# Patient Record
Sex: Female | Born: 1965 | Race: White | Hispanic: No | Marital: Married | Smoking: Former smoker
Health system: Southern US, Community
[De-identification: ages and names within clinical notes are randomized; demographics above are authoritative.]

## PROBLEM LIST (undated history)

## (undated) DIAGNOSIS — J329 Chronic sinusitis, unspecified: Secondary | ICD-10-CM

## (undated) DIAGNOSIS — G43909 Migraine, unspecified, not intractable, without status migrainosus: Secondary | ICD-10-CM

## (undated) DIAGNOSIS — S065XAA Traumatic subdural hemorrhage with loss of consciousness status unknown, initial encounter: Secondary | ICD-10-CM

## (undated) DIAGNOSIS — F4310 Post-traumatic stress disorder, unspecified: Secondary | ICD-10-CM

## (undated) DIAGNOSIS — S065X9A Traumatic subdural hemorrhage with loss of consciousness of unspecified duration, initial encounter: Secondary | ICD-10-CM

## (undated) DIAGNOSIS — C801 Malignant (primary) neoplasm, unspecified: Secondary | ICD-10-CM

## (undated) HISTORY — DX: Post-traumatic stress disorder, unspecified: F43.10

## (undated) HISTORY — PX: ADENOIDECTOMY: SUR15

## (undated) HISTORY — PX: TONSILLECTOMY: SUR1361

## (undated) HISTORY — DX: Malignant (primary) neoplasm, unspecified: C80.1

## (undated) HISTORY — PX: BREAST SURGERY: SHX581

## (undated) HISTORY — PX: TYMPANOSTOMY TUBE PLACEMENT: SHX32

---

## 2007-11-18 ENCOUNTER — Emergency Department (HOSPITAL_COMMUNITY): Admission: EM | Admit: 2007-11-18 | Discharge: 2007-11-18 | Payer: Self-pay | Admitting: Emergency Medicine

## 2010-10-28 ENCOUNTER — Emergency Department (HOSPITAL_COMMUNITY): Payer: Federal, State, Local not specified - PPO

## 2010-10-28 ENCOUNTER — Emergency Department (HOSPITAL_COMMUNITY)
Admission: EM | Admit: 2010-10-28 | Discharge: 2010-10-28 | Disposition: A | Discharge status: Home / Self Care | Payer: Federal, State, Local not specified - PPO | Attending: Emergency Medicine | Admitting: Emergency Medicine

## 2010-10-28 DIAGNOSIS — T07XXXA Unspecified multiple injuries, initial encounter: Secondary | ICD-10-CM | POA: Insufficient documentation

## 2010-10-28 DIAGNOSIS — M25519 Pain in unspecified shoulder: Secondary | ICD-10-CM | POA: Insufficient documentation

## 2010-10-28 DIAGNOSIS — S7000XA Contusion of unspecified hip, initial encounter: Secondary | ICD-10-CM | POA: Insufficient documentation

## 2010-10-28 DIAGNOSIS — M25569 Pain in unspecified knee: Secondary | ICD-10-CM | POA: Insufficient documentation

## 2010-10-28 DIAGNOSIS — R350 Frequency of micturition: Secondary | ICD-10-CM | POA: Insufficient documentation

## 2010-10-28 LAB — URINE MICROSCOPIC-ADD ON

## 2010-10-28 LAB — URINALYSIS, ROUTINE W REFLEX MICROSCOPIC
Glucose, UA: NEGATIVE mg/dL
Nitrite: NEGATIVE

## 2010-10-28 LAB — POCT PREGNANCY, URINE: Preg Test, Ur: NEGATIVE

## 2011-10-27 ENCOUNTER — Encounter (HOSPITAL_COMMUNITY): Payer: Self-pay | Admitting: Emergency Medicine

## 2011-10-27 ENCOUNTER — Emergency Department (HOSPITAL_COMMUNITY): Payer: Federal, State, Local not specified - PPO

## 2011-10-27 ENCOUNTER — Inpatient Hospital Stay (HOSPITAL_COMMUNITY)
Admission: EM | Admit: 2011-10-27 | Discharge: 2011-10-29 | DRG: 765 | Disposition: A | Discharge status: Home / Self Care | Payer: Federal, State, Local not specified - PPO | Attending: General Surgery | Admitting: General Surgery

## 2011-10-27 DIAGNOSIS — M25539 Pain in unspecified wrist: Secondary | ICD-10-CM | POA: Diagnosis present

## 2011-10-27 DIAGNOSIS — S0003XA Contusion of scalp, initial encounter: Secondary | ICD-10-CM | POA: Diagnosis present

## 2011-10-27 DIAGNOSIS — S065X9A Traumatic subdural hemorrhage with loss of consciousness of unspecified duration, initial encounter: Secondary | ICD-10-CM

## 2011-10-27 DIAGNOSIS — R109 Unspecified abdominal pain: Secondary | ICD-10-CM | POA: Diagnosis present

## 2011-10-27 DIAGNOSIS — Y92410 Unspecified street and highway as the place of occurrence of the external cause: Secondary | ICD-10-CM

## 2011-10-27 DIAGNOSIS — IMO0002 Reserved for concepts with insufficient information to code with codable children: Secondary | ICD-10-CM | POA: Diagnosis present

## 2011-10-27 DIAGNOSIS — T07XXXA Unspecified multiple injuries, initial encounter: Secondary | ICD-10-CM | POA: Diagnosis present

## 2011-10-27 DIAGNOSIS — S060XAA Concussion with loss of consciousness status unknown, initial encounter: Secondary | ICD-10-CM

## 2011-10-27 DIAGNOSIS — S060X9A Concussion with loss of consciousness of unspecified duration, initial encounter: Principal | ICD-10-CM | POA: Diagnosis present

## 2011-10-27 DIAGNOSIS — S065XAA Traumatic subdural hemorrhage with loss of consciousness status unknown, initial encounter: Secondary | ICD-10-CM

## 2011-10-27 DIAGNOSIS — Y998 Other external cause status: Secondary | ICD-10-CM

## 2011-10-27 DIAGNOSIS — G40909 Epilepsy, unspecified, not intractable, without status epilepticus: Secondary | ICD-10-CM | POA: Diagnosis present

## 2011-10-27 DIAGNOSIS — S1093XA Contusion of unspecified part of neck, initial encounter: Secondary | ICD-10-CM | POA: Diagnosis present

## 2011-10-27 DIAGNOSIS — F17200 Nicotine dependence, unspecified, uncomplicated: Secondary | ICD-10-CM | POA: Diagnosis present

## 2011-10-27 HISTORY — DX: Migraine, unspecified, not intractable, without status migrainosus: G43.909

## 2011-10-27 HISTORY — DX: Chronic sinusitis, unspecified: J32.9

## 2011-10-27 LAB — COMPREHENSIVE METABOLIC PANEL
ALT: 20 U/L (ref 0–35)
AST: 42 U/L — ABNORMAL HIGH (ref 0–37)
Albumin: 4 g/dL (ref 3.5–5.2)
Alkaline Phosphatase: 94 U/L (ref 39–117)
CO2: 30 mEq/L (ref 19–32)
Chloride: 103 mEq/L (ref 96–112)
GFR calc Af Amer: >90 mL/min (ref >90)
GFR calc non Af Amer: >90 mL/min (ref >90)
Potassium: 3.7 mEq/L (ref 3.5–5.1)
Sodium: 140 mEq/L (ref 135–145)
Total Bilirubin: 0.2 mg/dL — ABNORMAL LOW (ref 0.3–1.2)

## 2011-10-27 LAB — CBC WITH DIFFERENTIAL/PLATELET
Hemoglobin: 13.6 g/dL (ref 12.0–15.0)
Lymphocytes Relative: 12 % (ref 12–46)
Lymphs Abs: 1.6 10*3/uL (ref 0.7–4.0)
MCHC: 34.8 g/dL (ref 30.0–36.0)
MCV: 86.3 fL (ref 78.0–100.0)
Monocytes Absolute: 0.8 10*3/uL (ref 0.1–1.0)
Monocytes Relative: 6 % (ref 3–12)
Platelets: 197 10*3/uL (ref 150–400)
RDW: 12.6 % (ref 11.5–15.5)

## 2011-10-27 MED ORDER — MORPHINE SULFATE 4 MG/ML IJ SOLN
4.0000 mg | Freq: Once | INTRAMUSCULAR | Status: AC
  Administered 2011-10-27: 4 mg via INTRAVENOUS
  Filled 2011-10-27: qty 1

## 2011-10-27 MED ORDER — SODIUM CHLORIDE 0.9 % IV BOLUS (SEPSIS)
1000.0000 mL | Freq: Once | INTRAVENOUS | Status: AC
  Administered 2011-10-27: 1000 mL via INTRAVENOUS
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
## 2011-10-27 NOTE — ED Notes (Signed)
Pt c/o left lower buttocks pain, pt has a small abrasion on left lower buttocks and a light purple bruise surrounding the area.

## 2011-10-27 NOTE — ED Notes (Signed)
Family updated as to patient's status by the [facility].

## 2011-10-27 NOTE — ED Notes (Signed)
Pt ambulated to bathroom with no assistance.  
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
## 2011-10-28 ENCOUNTER — Encounter (HOSPITAL_COMMUNITY): Payer: Self-pay | Admitting: *Deleted

## 2011-10-28 ENCOUNTER — Inpatient Hospital Stay (HOSPITAL_COMMUNITY): Payer: Federal, State, Local not specified - PPO

## 2011-10-28 MED ORDER — MORPHINE SULFATE 2 MG/ML IJ SOLN
2.0000 mg | INTRAMUSCULAR | Status: DC | PRN
  Administered 2011-10-28 (×2): 2 mg via INTRAVENOUS
  Filled 2011-10-28 (×2): qty 1

## 2011-10-28 MED ORDER — INFLUENZA VIRUS VACC SPLIT PF IM SUSP
0.5000 mL | INTRAMUSCULAR | Status: DC
  Filled 2011-10-28: qty 0.5

## 2011-10-28 MED ORDER — OXYCODONE-ACETAMINOPHEN 5-325 MG PO TABS
1.0000 | ORAL_TABLET | ORAL | Status: DC | PRN
  Administered 2011-10-28 – 2011-10-29 (×4): 1 via ORAL
  Filled 2011-10-28 (×4): qty 1

## 2011-10-28 MED ORDER — ONDANSETRON HCL 4 MG/2ML IJ SOLN
4.0000 mg | Freq: Four times a day (QID) | INTRAMUSCULAR | Status: DC | PRN
  Administered 2011-10-28 (×3): 4 mg via INTRAVENOUS
  Filled 2011-10-28 (×3): qty 2

## 2011-10-28 MED ORDER — MORPHINE SULFATE 2 MG/ML IJ SOLN
INTRAMUSCULAR | Status: AC
  Filled 2011-10-28: qty 1

## 2011-10-28 MED ORDER — DEXTROSE-NACL 5-0.9 % IV SOLN
INTRAVENOUS | Status: DC
  Administered 2011-10-28 (×2): via INTRAVENOUS

## 2011-10-28 MED ORDER — BUTABARBITAL SODIUM 30 MG PO TABS: 50.0000 mg | ORAL_TABLET | Freq: Every day | ORAL | Status: DC | PRN

## 2011-10-28 MED ORDER — METOCLOPRAMIDE HCL 5 MG/ML IJ SOLN
10.0000 mg | Freq: Once | INTRAMUSCULAR | Status: AC
  Administered 2011-10-28: 10 mg via INTRAVENOUS
  Filled 2011-10-28: qty 2

## 2011-10-28 MED ORDER — BUTALBITAL-APAP-CAFFEINE 50-325-40 MG PO TABS
1.0000 | ORAL_TABLET | ORAL | Status: DC | PRN
  Administered 2011-10-28 – 2011-10-29 (×2): 2 via ORAL
  Filled 2011-10-28 (×2): qty 2
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
## 2011-10-28 NOTE — Progress Notes (Signed)
Report given to Puja RN on 4N, to transfer via wheelchair to [id number], Berle Mull RN
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
## 2011-10-28 NOTE — ED Notes (Signed)
Pt refused bedpan, pt ambulated to in room bathroom with no difficulties.

## 2011-10-28 NOTE — Progress Notes (Signed)
UR complete 
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
## 2011-10-28 NOTE — Clinical Social Work Psychosocial (Signed)
Clinical Social Work Department BRIEF PSYCHOSOCIAL ASSESSMENT 10/28/2011  Patient:  Caitlin Kennedy, Caitlin Kennedy     Account Number:  [id number]     Admit date:  10/27/2011  Clinical Social Worker:  Thomasene Mohair  Date/Time:  10/28/2011 11:30 AM  Referred by:  Physician  Date Referred:  10/28/2011 Referred for  Psychosocial assessment   Other Referral:   Interview type:  Patient Other interview type:   Husband at bedside    PSYCHOSOCIAL DATA Living Status:  HUSBAND Admitted from facility:   Level of care:   Primary support name:  Caitlin Kennedy Primary support relationship to patient:  SPOUSE Degree of support available:   adequate    CURRENT CONCERNS Current Concerns  Adjustment to Illness   Other Concerns:    SOCIAL WORK ASSESSMENT / PLAN CSW met with Pt to complete assessment as part of the multidisciplanary team with trauma services.  Pt in bed complaining of acute headache/migraine with spouse at bedside. 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  Pt's spouse also works and they both help care for his elderly father who lives outside of the home. Pt's spouse shared that they have been married 10 years and do not have children.  Pt will likely dc to the care of her husband if needed at all. CSW will f/u to complete SBIRT when Pt is alone and feeling better.   Assessment/plan status:  Psychosocial Support/Ongoing Assessment of Needs Other assessment/ plan:   F/U for SBIRT only   Information/referral to community resources:    PATIENT'S/FAMILY'S RESPONSE TO PLAN OF CARE: Pt's spouse at bedside, stated that he's hopeful Pt will be able to leave hospital soon. Pt sitting in the dark d/t to light sensitivity, complaining of painful migraine and wearing eye mask to block out light.   Frederico Hamman, LCSW [phone] Covering for Macario Golds, LCSW Trauma Service
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
## 2011-10-29 NOTE — Progress Notes (Signed)
Hope to D/C this PM Patient examined and I agree with the assessment and plan  Violeta Gelinas, MD, MPH, FACS Pager: [phone]  10/29/2011 3:44 PM

## 2011-10-29 NOTE — Discharge Summary (Signed)
Caitlin Snelson, MD, MPH, FACS Pager: [phone]  

## 2011-10-29 NOTE — Progress Notes (Signed)
Discharge Note: Pt is alert and oriented, VS are stable, denies CP.  Telemetry & IV discontinued.  Discharge instructions reviewed with patient, pt verbalizes understanding.  Wheelchair transportation provided, all belongings with patient  
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
## 2011-10-29 NOTE — Clinical Social Work Note (Addendum)
Clinical Social Worker met with patient and patient husband at bedside to confirm no further needs at discharge.  Patient husband states that he drove his truck to the hospital and they will have appropriate transportation home.  Patient is medically clear and anxious to discharge.  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  Patient with limited current substance use at this time and has no concerns.  SBIRT complete.  No resources needed.  Clinical Social Worker will sign off for now as social work intervention is no longer needed. Please consult [place] again if new need arises.  Macario Golds, [place] [phone]

## 2012-07-07 ENCOUNTER — Encounter (HOSPITAL_COMMUNITY): Payer: Self-pay | Admitting: Emergency Medicine

## 2012-07-07 ENCOUNTER — Emergency Department (HOSPITAL_COMMUNITY)
Admission: EM | Admit: 2012-07-07 | Discharge: 2012-07-07 | Disposition: A | Discharge status: Home / Self Care | Payer: Federal, State, Local not specified - PPO | Attending: Emergency Medicine | Admitting: Emergency Medicine

## 2012-07-07 ENCOUNTER — Emergency Department (HOSPITAL_COMMUNITY): Payer: Federal, State, Local not specified - PPO

## 2012-07-07 DIAGNOSIS — G43909 Migraine, unspecified, not intractable, without status migrainosus: Secondary | ICD-10-CM | POA: Insufficient documentation

## 2012-07-07 DIAGNOSIS — F17200 Nicotine dependence, unspecified, uncomplicated: Secondary | ICD-10-CM | POA: Insufficient documentation

## 2012-07-07 DIAGNOSIS — R110 Nausea: Secondary | ICD-10-CM | POA: Insufficient documentation

## 2012-07-07 DIAGNOSIS — Y939 Activity, unspecified: Secondary | ICD-10-CM | POA: Insufficient documentation

## 2012-07-07 DIAGNOSIS — S1093XA Contusion of unspecified part of neck, initial encounter: Secondary | ICD-10-CM | POA: Insufficient documentation

## 2012-07-07 DIAGNOSIS — W2209XA Striking against other stationary object, initial encounter: Secondary | ICD-10-CM | POA: Insufficient documentation

## 2012-07-07 DIAGNOSIS — S0003XA Contusion of scalp, initial encounter: Secondary | ICD-10-CM | POA: Insufficient documentation

## 2012-07-07 DIAGNOSIS — S0083XA Contusion of other part of head, initial encounter: Secondary | ICD-10-CM

## 2012-07-07 DIAGNOSIS — J329 Chronic sinusitis, unspecified: Secondary | ICD-10-CM | POA: Insufficient documentation

## 2012-07-07 DIAGNOSIS — Y929 Unspecified place or not applicable: Secondary | ICD-10-CM | POA: Insufficient documentation

## 2012-07-07 MED ORDER — OXYCODONE-ACETAMINOPHEN 5-325 MG PO TABS: 1.0000 | ORAL_TABLET | ORAL | Status: DC | PRN

## 2012-07-07 MED ORDER — OXYCODONE-ACETAMINOPHEN 5-325 MG PO TABS
1.0000 | ORAL_TABLET | Freq: Once | ORAL | Status: AC
  Administered 2012-07-07: 1 via ORAL
  Filled 2012-07-07: qty 1

## 2012-07-07 NOTE — ED Provider Notes (Signed)
History     CSN: [id number]  Arrival date & time 07/07/12  7829   First MD Initiated Contact with Patient 07/07/12 0935      Chief Complaint  Patient presents with  . Facial Injury    (Consider location/radiation/quality/duration/timing/severity/associated sxs/prior treatment) Patient is a 47 y.o. female presenting with facial injury. The history is provided by the patient.  Facial Injury Mechanism of injury:  Direct blow Location:  Face Associated symptoms: nausea   Associated symptoms: no headaches, no neck pain and no vomiting   Associated symptoms comment:  She accidentally walked into a cement piling, hitting the left side of her face causing swelling and pain. Pain is crossing into the right side of the face as well. No LOC at the time of the accident. She reports nausea without vomiting.    Past Medical History  Diagnosis Date  . Migraine     chronic  . Sinus infection     chronic    Past Surgical History  Procedure Laterality Date  . Tonsillectomy      No family history on file.  History  Substance Use Topics  . Smoking status: Current Some Day Smoker    Types: Cigarettes  . Smokeless tobacco: Not on file  . Alcohol Use: Yes    OB History   Grav Para Term Preterm Abortions TAB SAB Ect Mult Living                  Review of Systems  Constitutional: Negative for fever.  HENT: Positive for facial swelling. Negative for neck pain and dental problem.   Respiratory: Negative for shortness of breath.   Gastrointestinal: Positive for nausea. Negative for vomiting and abdominal pain.  Skin: Negative for wound.  Neurological: Negative for headaches.  Psychiatric/Behavioral: Negative for confusion.    Allergies  Hydrocodone  Home Medications   Current Outpatient Rx  Name  Route  Sig  Dispense  Refill  . Butalbital-Acetaminophen (BUTALBITAL-APAP PO)   Oral   Take 1 tablet by mouth every 6 (six) hours as needed (headaches).         . Citalopram  Hydrobromide (CELEXA PO)   Oral   Take 0.5 tablets by mouth daily. Pt takes a 1/2 tablet. Not [facility] dose[facility] [place]         . GUAIFENESIN PO   Oral   Take 2 tablets by mouth every 6 (six) hours as needed (Allergy symptoms).           BP 123/80  Pulse 76  Temp(Src) 98.4 F (36.9 C) (Oral)  Resp 18  Wt 134 lb 8 oz (61.009 kg)  BMI 21.72 kg/m2  SpO2 98%  LMP 06/16/2012  Physical Exam  Constitutional: She is oriented to person, place, and time.  HENT:  Left facial swelling without ecchymosis. Significantly tender over left maxillary sinus and zygomatic arch. No maxillary plate tenderness or malocclusion. No mandibular tenderness. No nasal deformity.  Eyes: Conjunctivae are normal. Pupils are equal, round, and reactive to light.  Neck: Normal range of motion.  Pulmonary/Chest: Effort normal.  Musculoskeletal: Normal range of motion.  Neurological: She is alert and oriented to person, place, and time.  Ambulatory without imbalance. Cranial nerves 3-12 grossly intact.   Skin: Skin is warm and dry.  Psychiatric: She has a normal mood and affect.    ED Course  Procedures (including critical care time)  Labs Reviewed - No data to display Ct Maxillofacial Wo Cm  07/07/2012   *RADIOLOGY REPORT*  Clinical Data: Left facial injury after running into wall.  Facial swelling and pain.  CT MAXILLOFACIAL WITHOUT CONTRAST  Technique:  Multidetector CT imaging of the maxillofacial structures was performed. Multiplanar CT image reconstructions were also generated.  Comparison: CT maxillofacial 10/27/2011  Findings: There is subcutaneous stranding and soft tissue prominence in the left maxillary region, consistent with history of recent trauma to the left face.  The facial bones are intact.  Negative for facial fracture.  The paranasal sinuses are clear.  The mandibular condyles are located.  IMPRESSION: 1. Soft tissue swelling/bruising in the left maxillary region. 2.  Negative  for facial bone fracture.   Original Report Authenticated By: Britta Mccreedy, M.D.     No diagnosis found. 1. Facial contusion   MDM  No facial fractures. Doubt IC head injury with nonfocal neuro exam. Pain is improved with Percocet. Stable for discharge.         Arnoldo Hooker, PA-C 07/07/12 1114

## 2012-07-07 NOTE — ED Provider Notes (Signed)
  Medical screening examination/treatment/procedure(s) were performed by non-physician practitioner and as supervising physician I was immediately available for consultation/collaboration.    Gerhard Munch, MD 07/07/12 1135

## 2012-07-07 NOTE — ED Notes (Signed)
Pt c/o of left facial injury after she ran into a wall. C/o of facial swelling, pain 10/10, and headache. Denies LOC.

## 2014-03-21 ENCOUNTER — Emergency Department (HOSPITAL_COMMUNITY)
Admission: EM | Admit: 2014-03-21 | Discharge: 2014-03-21 | Disposition: A | Discharge status: Home / Self Care | Payer: Federal, State, Local not specified - PPO | Attending: Emergency Medicine | Admitting: Emergency Medicine

## 2014-03-21 ENCOUNTER — Encounter (HOSPITAL_COMMUNITY): Payer: Self-pay | Admitting: Emergency Medicine

## 2014-03-21 DIAGNOSIS — K088 Other specified disorders of teeth and supporting structures: Secondary | ICD-10-CM | POA: Insufficient documentation

## 2014-03-21 DIAGNOSIS — Z87891 Personal history of nicotine dependence: Secondary | ICD-10-CM | POA: Insufficient documentation

## 2014-03-21 DIAGNOSIS — Z8679 Personal history of other diseases of the circulatory system: Secondary | ICD-10-CM | POA: Insufficient documentation

## 2014-03-21 DIAGNOSIS — K0889 Other specified disorders of teeth and supporting structures: Secondary | ICD-10-CM

## 2014-03-21 MED ORDER — OXYCODONE-ACETAMINOPHEN 5-325 MG PO TABS: 1.0000 | ORAL_TABLET | ORAL | Status: DC | PRN

## 2014-03-21 MED ORDER — OXYCODONE-ACETAMINOPHEN 5-325 MG PO TABS
1.0000 | ORAL_TABLET | Freq: Once | ORAL | Status: AC
  Administered 2014-03-21: 1 via ORAL
  Filled 2014-03-21: qty 1

## 2014-03-21 NOTE — Discharge Instructions (Signed)
Take the prescribed medication as directed. Follow-up with dentist-- see referrals and resource guide. Return to the ED for new or worsening symptoms.   Emergency Department Resource Guide 1) Find a Doctor and Pay Out of Pocket Although you won't have to find out who is covered by your insurance plan, it is a good idea to ask around and get recommendations. You will then need to call the office and see if the doctor you have chosen will accept you as a new patient and what types of options they offer for patients who are self-pay. Some doctors offer discounts or will set up payment plans for their patients who do not have insurance, but you will need to ask so you aren't surprised when you get to your appointment.  2) Contact Your Local Health Department Not all health departments have doctors that can see patients for sick visits, but many do, so it is worth a call to see if yours does. If you don't know where your local health department is, you can check in your phone book. The CDC also has a tool to help you locate your state's health department, and many state websites also have listings of all of their local health departments.  3) Find a [facility] If your illness is not likely to be very severe or complicated, you may want to try a walk in clinic. These are popping up all over the country in pharmacies, drugstores, and shopping centers. They're usually staffed by nurse practitioners or physician assistants that have been trained to treat common illnesses and complaints. They're usually fairly quick and inexpensive. However, if you have serious medical issues or chronic medical problems, these are probably not your best option.  No Primary Care Doctor: - Call Health Connect at  [phone] - they can help you locate a primary care doctor that  accepts your insurance, provides certain services, etc. - Physician Referral Service- [phone]  Chronic Pain Problems: Organization          Address  Phone   Notes  [facility]  [phone] Patients need to be referred by their primary care doctor.   Medication Assistance: Organization         Address  Phone   Notes  [facility] Medication [facility] [address] [place], [place] [address] [phone] --Must be a resident of [facility] -- Must have NO insurance coverage whatsoever (no Medicaid/ Medicare, etc.) -- The pt. MUST have a primary care doctor that directs their care regularly and follows them in the community   Med[facility]ist  [phone]   [employer]  [phone]    Agencies that provide inexpensive medical care: Organization         Address  Phone   Notes  [place]  [phone]   [facility]    [phone]   [facility] [place], [place] [address] [phone]   [place] [address], [place] [phone]   Planned Parenthood    [phone]   [facility]    [phone]   [place] and [place] [address], [place] Phone:  [phone], Fax:  [phone] Hours of Operation:  9 am - 6 pm, M-F.  Also accepts Medicaid/Medicare and self-pay.  [facility] for [place] [address], [employer] Phone: [phone])  [phone], Fax: [phone]. Hours of Operation:  8:30 am - 5:30 pm, M-F.  Also accepts Medicaid and self-pay.  [facility] [place] [address], [place] Phone: [phone]   [place], [place], [place] [phone], Ext. [phone] Mondays & Thursdays: 7-9 AM.  First 15 patients are seen on a first come, first serve basis.    [place] Providers:  Organization         Address  Phone   Notes  [facility] [address], [place] [phone] Also accepts self-pay patients.  [facility] [address], [place]  [phone]   [place], [address], [place] [phone]   [facility] [address] [place] [phone]   Lucianne Lei [address], [place]   [phone] Only accepts [place] Access [place] patients after they have their name applied to their card.   Self-Pay (no insurance) in [facility]:  Organization         Address  Phone   Notes  Sickle Cell Patients, [facility] [place] [phone]   [facility] Urgent Care [place] [phone]   [facility] [place]  [place], [place], [place] [phone]   [facility]/Dr. Osei-Bonsu  [address] [place] or [address], [place] [phone] Phone number for both [place] and [place] locations is the same.  Urgent Medical and [facility] [address] [place] [phone]   [facility] [address], [place] or [address] [phone]   [facility] [address] [place] [phone], phone; [phone], fax Sees patients 1st and 3rd Saturday of every month.  Must not qualify for public or private insurance (i.e. Medicaid, Medicare, [place] Health Choice, Veterans' Benefits)  Household income should be no more than 200% of the poverty level The clinic cannot treat you if you are pregnant or think you are pregnant  Sexually transmitted diseases are not treated at the clinic.    Dental Care: Organization         Address  Phone  Notes  [facility] Department of [facility] [place] [phone] Accepts children up to age 23 who are enrolled in [place] or [place]; pregnant women with a Medicaid card; and  children who have applied for Medicaid or [place] Health Choice, but were declined, whose parents can pay a reduced fee at time of service.  [facility] Department of [facility]  [address], [place] [phone] Accepts children up to age 22 who are enrolled in [place] or [place]; pregnant women with a Medicaid card; and children who have applied for Medicaid or [place] Health Choice, but were declined, whose parents can pay a reduced fee at time of service.  [place] Adult Dental Access PROGRAM  [place] [phone] Patients are seen by appointment only. Walk-ins are not accepted. [place] will see patients 70 years of age and older. Monday - Tuesday (8am-5pm) Most Wednesdays (8:30-5pm) $30 per visit, cash only  [place] Adult Dental Access PROGRAM  [address], [employer]  [place] [phone] Patients are seen by appointment only. Walk-ins are not accepted. [place] will see patients 13 years of age and older. One Wednesday Evening (Monthly: Volunteer Based).  $30 per visit, cash only  [place]  [phone] for adults; Children under age 56, call Graduate Pediatric Dentistry at [phone]. Children aged 53-14, please call [phone] to request a pediatric application.  Dental services are provided in all areas of dental care including fillings, crowns and bridges, complete and partial dentures, implants, gum treatment, root canals, and extractions. Preventive care is also provided. Treatment is provided to both adults and children. Patients are selected via a lottery and there is often a waiting list.   [facility] [address] [place]  [phone] [email or web address]   [facility] [address] [place], [place] [phone], Ext. [phone] Second and Fourth Thursday of each month, opens at 6:30 AM; Clinic ends at 9 AM.  Patients are seen on a first-come first-served  basis, and a limited number are seen during each clinic.   [facility]  [address] Hillard Danker [place], [place] [phone]   Eligibility Requirements You must have lived in [place], [place], or [place] counties for at least the last three months.   You cannot be eligible for state or federal sponsored [employer], including [employer], [place], or [employer].   You generally cannot be eligible for healthcare insurance through your employer.    How to apply: Eligibility screenings are held every Tuesday and Wednesday afternoon from 1:00 pm until 4:00 pm. You do not need an appointment for the interview!  [facility] [address], [place], [place]   [place]  [place] Department  [place]  [phone]    Behavioral Health Resources in the Community: Intensive Outpatient Programs Organization         Address  Phone  Notes  [place]. [address] [place], [place] [phone]   [facility] Outpatient [address], [place], [place]   ADS: [facility] Drug S[facility] [address] [place], [place]   [place] [address],  [place], [place] or [phone]   Substance Abuse Resources Organization         Address  Phone  Notes  Alcohol and Drug Services  [phone]   [place]  [phone]   [facility]   Chinita Pester  [phone]   Residential & Outpatient Substance Abuse Program  [phone]   Psychological Services Organization         Address  Phone  Notes  [facility]  [place]  [phone]   [place] [address] [place] or [phone]    Mobile Crisis Teams Organization          Address  Phone  Notes  [facility], Mobile Crisis Care Unit  [phone]   [facility]  [address] [place], [place]   [place] [address] [place] [phone]    Self-Help/Support Groups Organization         Address  Phone             Notes  [place]. of [place] - variety of support groups  336- H3156881 Call for more information  Narcotics Anonymous (NA), [facility] [address], [employer] [place]  2 meetings at this location   Residential Facilities manager         Address  Phone  Notes  [facility]idential Treatment [place],    [place]  [phone]   [facility]  [address], [place] [id number], [place], [place]   [place], [place] [phone] Admissions: 8am-3pm M-F  Incentives Substance [place] [address]    [employer], [place] [phone]   [facility] [address] [place], [place], [place]   [facility] [address],  [place], [place]   [facility] - Intensive Outpatient [address], [place], [place]   [facility] ([place].,  [place], [place] [phone] or [phone]   [facility] (RTS) [address], [place], [place] Accepts Medicaid  [facility] [place] [address]  [place] [phone] Substance Abuse/Addiction Treatment   [facility] Organization         Address  Phone  Notes  [facility]  [phone]   Domenic Schwab, PhD [address] Arlis Porta [place], [place]   [phone] or [phone]   [place], [place] [phone]   [facility] [address], [place], [place] [phone] Insurance/Medicaid/sponsorship  through [facility] and Families [address]                                    [place], [place] [phone] [place] [address], [place] [phone]    Dr. Adele Schilder  [phone]   [facility] [place] Dept. 1) [address] [place] 2) [place] 3)  [place], [place] [phone]  [phone]   [place] [phone] or [phone] (After Hours)

## 2014-03-21 NOTE — ED Provider Notes (Signed)
CSN: [id number]     Arrival date & time 03/21/14  1916 History  This chart was scribed for Quincy Carnes, PA-C, working with Arbie Cookey, MD by Steva Colder, ED Scribe. The patient was seen in room WTR1/WLPT1 at 7:38 PM.    Chief Complaint  Patient presents with  . Dental Pain     The history is provided by the patient. No language interpreter was used.    HPI Comments: Caitlin Kennedy is a 49 y.o. female who presents to the Emergency Department complaining of left upper and bottom dental pain onset today. She reports that she she has a toothache on the top and bottom left side of her mouth. She notes that the pain happened initially 1 week ago. She notes that her tooth has broken. She denies facial swelling or difficulty swallowing.  No fever or chills.  She reports that she was at the [place] when she had her last dental work done. She states that she has tried tylenol and Motrin with no relief for her symptoms. She denies fever and any other symptoms. She notes that she is allergic to hydrocodone.  Past Medical History  Diagnosis Date  . Migraine     chronic  . Sinus infection     chronic   Past Surgical History  Procedure Laterality Date  . Tonsillectomy     Family History  Problem Relation Age of Onset  . Cancer Mother   . Cancer Other   . Diabetes Other   . CAD Other    History  Substance Use Topics  . Smoking status: Former Smoker    Types: Cigarettes  . Smokeless tobacco: Not on file  . Alcohol Use: Yes   OB History    No data available     Review of Systems  HENT: Positive for dental problem. Negative for facial swelling.   All other systems reviewed and are negative.     Allergies  Hydrocodone  Home Medications   Prior to Admission medications   Medication Sig Start Date End Date Taking? Authorizing Provider  Butalbital-Acetaminophen (BUTALBITAL-APAP PO) Take 1 tablet by mouth every 6 (six) hours as needed (headaches).    Historical Provider, MD   Citalopram Hydrobromide (CELEXA PO) Take 0.5 tablets by mouth daily. Pt takes a 1/2 tablet. Not [facility] dose[facility] [place].    Historical Provider, MD  GUAIFENESIN PO Take 2 tablets by mouth every 6 (six) hours as needed (Allergy symptoms).    Historical Provider, MD  oxyCODONE-acetaminophen (PERCOCET/ROXICET) 5-325 MG per tablet Take 1 tablet by mouth every 4 (four) hours as needed for pain. 07/07/12   Shari A Upstill, PA-C   BP 132/89 mmHg  Pulse 78  Resp 20  SpO2 99%  Physical Exam  Constitutional: She is oriented to person, place, and time. She appears well-developed and well-nourished.  HENT:  Head: Normocephalic and atraumatic.  Mouth/Throat: Oropharynx is clear and moist. Abnormal dentition. No dental abscesses.  Teeth largely in poor dentition,  Left upper pre molar broken with partial filling present, left lower molar with filling present, surrounding gingiva nl without signs of dental abcess, handling secretions appropriately, no trismus.  Eyes: Conjunctivae and EOM are normal. Pupils are equal, round, and reactive to light.  Neck: Normal range of motion.  Cardiovascular: Normal rate, regular rhythm and normal heart sounds.   Pulmonary/Chest: Effort normal and breath sounds normal.  Abdominal: Soft. Bowel sounds are normal.  Musculoskeletal: Normal range of motion.  Neurological: She is alert and oriented to person, place, and time.  Skin: Skin is warm and dry.  Psychiatric: She has a normal mood and affect.  Nursing note and vitals reviewed.   ED Course  Procedures (including critical care time) DIAGNOSTIC STUDIES: Oxygen Saturation is 99% on room air, normal by my interpretation.    COORDINATION OF CARE: 7:42 PM-Discussed treatment plan which includes Percocet with pt at bedside and pt agreed to plan.   Labs Review Labs Reviewed - No data to display  Imaging Review No results found.   EKG Interpretation None      MDM   Final diagnoses:  Pain,  dental   Dental pain without signs of dental abscess. No facial or neck swelling to suggest ludwigs angina.  Patient will be d/c home with pain meds.  She will FU with dentist, referrals and resource guide provided.  Discussed plan with patient, he/she acknowledged understanding and agreed with plan of care.  Return precautions given for new or worsening symptoms.  I personally performed the services described in this documentation, which was scribed in my presence. The recorded information has been reviewed and is accurate.   Larene Pickett, PA-C 03/21/14 2009  Houston Siren III, MD 03/22/14 [phone]

## 2014-03-21 NOTE — ED Notes (Signed)
Pt is c/o toothache on the top and bottom on the left side  Pt states pain got this bad a couple hours ago

## 2015-05-29 DIAGNOSIS — M19012 Primary osteoarthritis, left shoulder: Secondary | ICD-10-CM | POA: Diagnosis not present

## 2015-05-29 DIAGNOSIS — W19XXXA Unspecified fall, initial encounter: Secondary | ICD-10-CM | POA: Diagnosis not present

## 2015-05-29 DIAGNOSIS — G43709 Chronic migraine without aura, not intractable, without status migrainosus: Secondary | ICD-10-CM | POA: Diagnosis not present

## 2015-05-29 DIAGNOSIS — H524 Presbyopia: Secondary | ICD-10-CM | POA: Diagnosis not present

## 2015-05-29 DIAGNOSIS — R9431 Abnormal electrocardiogram [ECG] [EKG]: Secondary | ICD-10-CM | POA: Diagnosis not present

## 2015-05-29 DIAGNOSIS — M25512 Pain in left shoulder: Secondary | ICD-10-CM | POA: Diagnosis not present

## 2015-05-29 DIAGNOSIS — S0012XA Contusion of left eyelid and periocular area, initial encounter: Secondary | ICD-10-CM | POA: Diagnosis not present

## 2015-05-29 DIAGNOSIS — J309 Allergic rhinitis, unspecified: Secondary | ICD-10-CM | POA: Diagnosis not present

## 2015-06-05 DIAGNOSIS — M25512 Pain in left shoulder: Secondary | ICD-10-CM | POA: Diagnosis not present

## 2015-06-05 DIAGNOSIS — R200 Anesthesia of skin: Secondary | ICD-10-CM | POA: Diagnosis not present

## 2015-06-05 DIAGNOSIS — M47812 Spondylosis without myelopathy or radiculopathy, cervical region: Secondary | ICD-10-CM | POA: Diagnosis not present

## 2015-06-08 ENCOUNTER — Ambulatory Visit (INDEPENDENT_AMBULATORY_CARE_PROVIDER_SITE_OTHER): Payer: Federal, State, Local not specified - PPO | Admitting: Family

## 2015-06-08 VITALS — Temp 97.0°F | Ht 66.0 in | Wt 189.2 lb

## 2015-06-08 DIAGNOSIS — H01003 Unspecified blepharitis right eye, unspecified eyelid: Secondary | ICD-10-CM

## 2015-06-08 MED ORDER — BACITRACIN-POLYMYXIN B 500-10000 UNIT/GM OP OINT: 1.0000 "application " | TOPICAL_OINTMENT | Freq: Two times a day (BID) | OPHTHALMIC | Status: DC

## 2015-06-08 NOTE — Patient Instructions (Signed)
Blepharitis Blepharitis is inflammation of the eyelids. Blepharitis may happen with:  Reddish, scaly skin around the scalp and eyebrows.  Burning or itching of the eyelids.  Eye discharge at night that causes the eyelashes to stick together in the morning.  Eyelashes that fall out.  Sensitivity to light. HOME CARE INSTRUCTIONS Pay attention to any changes in how you look or feel. Follow these instructions to help with your condition: Keeping Clean  Wash your hands often.  Wash your eyelids with warm water or with warm water that is mixed with a small amount of baby shampoo. Do this two times per day or as often as needed.  Wash your face and eyebrows at least once a day.  Use a clean towel each time you dry your eyelids. Do not use this towel to clean or dry other areas of your body. Do not share your towel with anyone. General Instructions  Avoid wearing makeup until you get better. Do not share makeup with anyone.  Avoid rubbing your eyes.  Apply warm compresses to your eyes 2 times per day for 10 minutes at a time, or as told by your health care provider.  If you were prescribed an antibiotic ointment or steroid drops, apply or use the medicine as told by your health care provider. Do not stop using the medicine even if you feel better.  Keep all follow-up visits as told by your health care provider. This is important. SEEK MEDICAL CARE IF:  Your eyelids feel hot.  You have blisters or a rash on your eyelids.  The condition does not go away in 2-4 days.  The inflammation gets worse. SEEK IMMEDIATE MEDICAL CARE IF:  You have pain or redness that gets worse or spreads to other parts of your face.  Your vision changes.  You have pain when looking at lights or moving objects.  You have a fever.   This information is not intended to replace advice given to you by your health care provider. Make sure you discuss any questions you have with your health care  provider.   Document Released: 01/31/2000 Document Revised: 10/24/2014 Document Reviewed: 05/28/2014 Elsevier Interactive Patient Education 2016 [employer]  

## 2015-06-08 NOTE — Progress Notes (Signed)
   Subjective:    Patient ID: Caitlin Kennedy, female    DOB: July 25, 1965, 50 y.o.   MRN: [id number]  Pt presents to the office today to establish care and is complaining of right eye pain.  Eye Pain  The right eye is affected. This is a new problem. The current episode started 1 to 4 weeks ago. The problem occurs constantly. Associated symptoms include eye redness. Pertinent negatives include no eye discharge, double vision, itching, nausea or vomiting. She has tried nothing for the symptoms. The treatment provided no relief.      Review of Systems  Eyes: Positive for pain and redness. Negative for double vision and discharge.  Gastrointestinal: Negative for nausea and vomiting.  Skin: Negative for itching.  All other systems reviewed and are negative.  Family History  Problem Relation Age of Onset  . Cancer Mother   . Cancer Other   . Diabetes Other   . CAD Other     Social History   Social History  . Marital Status: Married    Spouse Name: N/A  . Number of Children: N/A  . Years of Education: N/A   Occupational History  . Not on file.   Social History Main Topics  . Smoking status: Former Smoker    Types: Cigarettes  . Smokeless tobacco: Not on file  . Alcohol Use: No  . Drug Use: No  . Sexual Activity: Yes   Other Topics Concern  . Not on file   Social History Narrative        Objective:   Physical Exam  Constitutional: She is oriented to person, place, and time. She appears well-developed and well-nourished. No distress.  HENT:  Head: Normocephalic and atraumatic.  Mouth/Throat: Oropharynx is clear and moist.  Right eye lid erythemas and swollen    Eyes: Pupils are equal, round, and reactive to light.  Neck: Normal range of motion. Neck supple. No thyromegaly present.  Cardiovascular: Normal rate, regular rhythm, normal heart sounds and intact distal pulses.   No murmur heard. Pulmonary/Chest: Effort normal and breath sounds normal. No respiratory  distress. She has no wheezes.  Abdominal: Soft. Bowel sounds are normal. She exhibits no distension. There is no tenderness.  Musculoskeletal: Normal range of motion. She exhibits no edema or tenderness.  Neurological: She is alert and oriented to person, place, and time. She has normal reflexes. No cranial nerve deficit.  Skin: Skin is warm and dry.  Psychiatric: She has a normal mood and affect. Her behavior is normal. Judgment and thought content normal.  Vitals reviewed.   Temp(Src) 97 F (36.1 C) (Oral)  Ht 5\' 6"  (1.676 m)  Wt 189 lb 3.2 oz (85.821 kg)  BMI 30.55 kg/m2       Assessment & Plan:  1. Blepharitis, right -Good hand hygiene discussed -Do not rub eyes -Cool compresses as needed -RTO prn - bacitracin-polymyxin b (POLYSPORIN) ophthalmic ointment; Place 1 application into the right eye every 12 (twelve) hours. apply to eye every 12 hours while awake  Dispense: 3.5 g; Refill: 0  Evelina Dun, FNP

## 2015-06-26 DIAGNOSIS — R9431 Abnormal electrocardiogram [ECG] [EKG]: Secondary | ICD-10-CM | POA: Diagnosis not present

## 2015-08-15 DIAGNOSIS — M25512 Pain in left shoulder: Secondary | ICD-10-CM | POA: Diagnosis not present

## 2015-08-19 ENCOUNTER — Encounter (HOSPITAL_COMMUNITY): Payer: Self-pay | Admitting: Emergency Medicine

## 2015-08-19 ENCOUNTER — Emergency Department (HOSPITAL_COMMUNITY)
Admission: EM | Admit: 2015-08-19 | Discharge: 2015-08-19 | Disposition: A | Discharge status: Home / Self Care | Payer: Federal, State, Local not specified - PPO | Attending: Emergency Medicine | Admitting: Emergency Medicine

## 2015-08-19 ENCOUNTER — Emergency Department (HOSPITAL_COMMUNITY): Payer: Federal, State, Local not specified - PPO

## 2015-08-19 DIAGNOSIS — Z87891 Personal history of nicotine dependence: Secondary | ICD-10-CM | POA: Diagnosis not present

## 2015-08-19 DIAGNOSIS — M545 Low back pain, unspecified: Secondary | ICD-10-CM

## 2015-08-19 HISTORY — DX: Traumatic subdural hemorrhage with loss of consciousness of unspecified duration, initial encounter: S06.5X9A

## 2015-08-19 HISTORY — DX: Traumatic subdural hemorrhage with loss of consciousness status unknown, initial encounter: S06.5XAA

## 2015-08-19 MED ORDER — METHOCARBAMOL 500 MG PO TABS
500.0000 mg | ORAL_TABLET | Freq: Once | ORAL | Status: AC
  Administered 2015-08-19: 500 mg via ORAL
  Filled 2015-08-19: qty 1

## 2015-08-19 MED ORDER — METHOCARBAMOL 500 MG PO TABS: 500.0000 mg | ORAL_TABLET | Freq: Two times a day (BID) | ORAL | Status: DC | PRN

## 2015-08-19 MED ORDER — HYDROMORPHONE HCL 1 MG/ML IJ SOLN
1.0000 mg | INTRAMUSCULAR | Status: AC
  Administered 2015-08-19: 1 mg via INTRAMUSCULAR
  Filled 2015-08-19: qty 1

## 2015-08-19 MED ORDER — IBUPROFEN 800 MG PO TABS: 800.0000 mg | ORAL_TABLET | Freq: Three times a day (TID) | ORAL | Status: DC

## 2015-08-19 MED ORDER — METHYLPREDNISOLONE 4 MG PO TBPK: ORAL_TABLET | ORAL | Status: DC

## 2015-08-19 NOTE — ED Notes (Signed)
Pt is c/o lower back pain  Pt states it hurts to stand up, sit down, or lay down  Pt states she has been having problems with her back for a while but is worse tonight

## 2015-08-19 NOTE — Discharge Instructions (Signed)

## 2015-08-19 NOTE — ED Provider Notes (Signed)
CSN: [id number]     Arrival date & time 08/19/15  0111 History  By signing my name below, I, Caitlin Kennedy, attest that this documentation has been prepared under the direction and in the presence of Caitlin Chapel, MD . Electronically Signed: Higinio Kennedy, Scribe. 08/19/2015. 3:08 AM.   Chief Complaint  Patient presents with  . Back Pain   The history is provided by the patient. No language interpreter was used.   HPI Comments: Caitlin Kennedy is a 50 y.o. female who presents to the Emergency Department complaining of gradually worsening, intermittent, radiating back pain that began a couple weeks ago but worsened today. Pt states her back pain is radiating towards her buttocks and legs; she notes her pain is worsened when she bends down. Pt notes she was unable to drive back from [place], [place] due to her pain. 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 She denies recent injury; though she notes a hx of back problems since she was 50 y.o. Pt denies numbness, weakness, fever, difficulty urinating, and hx of cancer. She states she has taken ibuprofen and hydrocodone with allergy medication for pain with no relief.  Past Medical History  Diagnosis Date  . Migraine     chronic  . Sinus infection     chronic  . PTSD (post-traumatic stress disorder)   . 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 Tonsillectomy     Family History  Problem Relation Age of Onset  . Cancer Mother   . Cancer Other   . Diabetes Other   . CAD Other    Social History  Substance Use Topics  . Smoking status: Former Smoker    Types: Cigarettes  . Smokeless tobacco: None  . Alcohol Use: Yes     Comment: rare   OB History    No data available     Review of Systems  Constitutional: Negative for fever.  Genitourinary: Negative for difficulty urinating.  Musculoskeletal: Positive for back pain.   Neurological: Negative for weakness and numbness.   Allergies  Codeine; Hydrocodone; and Oxycodone  Home Medications   Prior to Admission medications   Medication Sig Start Date End Date Taking? Authorizing Provider  bacitracin-polymyxin b (POLYSPORIN) ophthalmic ointment Place 1 application into the right eye every 12 (twelve) hours. apply to eye every 12 hours while awake 06/08/15   Caitlin Balloon, FNP  butalbital-acetaminophen-caffeine (FIORICET, ESGIC) 50-325-40 MG per tablet Take 1 tablet by mouth 2 (two) times daily as needed for headache or migraine.    Historical Provider, MD  ibuprofen (ADVIL,MOTRIN) 800 MG tablet Take 1 tablet (800 mg total) by mouth 3 (three) times daily. 08/19/15   Caitlin Chapel, MD  methocarbamol (ROBAXIN) 500 MG tablet Take 1 tablet (500 mg total) by mouth 2 (two) times daily as needed for muscle spasms. 08/19/15   Caitlin Chapel, MD  methylPREDNISolone (MEDROL DOSEPAK) 4 MG TBPK tablet Taper over 6 days 08/19/15   Caitlin Chapel, MD  montelukast (SINGULAIR) 10 MG tablet Take 10 mg by mouth daily. 07/24/15   Historical Provider, MD   BP 140/92 mmHg  Pulse 99  Temp(Src) 97.9 F (36.6 C) (Oral)  Resp 22  SpO2 100%  LMP 06/16/2012 Physical Exam  Constitutional: She appears well-developed and well-nourished.  HENT:  Head: Normocephalic and atraumatic.  Eyes: Conjunctivae are normal. Right eye exhibits  no discharge. Left eye exhibits no discharge.  Pulmonary/Chest: Effort normal. No respiratory distress.  Musculoskeletal:  Tenderness to spinal, paraspinal and lateral back lumbar  No thoracic or cercial tenderness    Neurological: She is alert. Coordination normal.  Neurologic exam:  Speech clear, pupils equal round reactive to light, extraocular movements intact  Normal peripheral visual fields Cranial nerves III through XII normal including no facial droop Follows commands, moves all extremities x4, normal strength to bilateral upper and lower extremities at all  major muscle groups including grip Sensation normal to light touch and pinprick Coordination intact, no limb ataxia, finger-nose-finger normal Rapid alternating movements normal No pronator drift Gait normal   Skin: Skin is warm and dry. No rash noted. She is not diaphoretic. No erythema.  Psychiatric: She has a normal mood and affect.  Nursing note and vitals reviewed.   ED Course  Procedures  DIAGNOSTIC STUDIES:  Oxygen Saturation is 100% on RA, normal by my interpretation.    COORDINATION OF CARE:  3:08 AM Discussed treatment Kennedy, which includes back pain - pain meds / muscle relaxants with pt at bedside and pt agreed to Kennedy.  Labs Review Labs Reviewed - No data to display  Imaging Review Dg Lumbar Spine Complete  08/19/2015  CLINICAL DATA:  Acute onset of worsening lower back pain. Initial encounter. EXAM: LUMBAR SPINE - COMPLETE 4+ VIEW COMPARISON:  Lumbar spine radiographs performed 11/18/2007 FINDINGS: There is no evidence of fracture or subluxation. Vertebral bodies demonstrate normal height and alignment. Intervertebral disc spaces are preserved. The visualized neural foramina are grossly unremarkable in appearance. The visualized bowel gas pattern is unremarkable in appearance; air and stool are noted within the colon. The sacroiliac joints are within normal limits. IMPRESSION: No evidence of fracture or subluxation along the lumbar spine. Electronically Signed   By: Caitlin Kennedy M.D.   On: 08/19/2015 02:44   I have personally reviewed and evaluated these images and lab results as part of my medical decision-making.    MDM   Final diagnoses:  Bilateral low back pain without sciatica   I personally performed the services described in this documentation, which was scribed in my presence. The recorded information has been reviewed and is accurate.   Improved with meds - still having pain Back pain to palpation. Will f/u No red flags.   Meds given in  ED:  Medications  HYDROmorphone (DILAUDID) injection 1 mg (1 mg Intramuscular Given 08/19/15 0146)  methocarbamol (ROBAXIN) tablet 500 mg (500 mg Oral Given 08/19/15 0143)    New Prescriptions   IBUPROFEN (ADVIL,MOTRIN) 800 MG TABLET    Take 1 tablet (800 mg total) by mouth 3 (three) times daily.   METHOCARBAMOL (ROBAXIN) 500 MG TABLET    Take 1 tablet (500 mg total) by mouth 2 (two) times daily as needed for muscle spasms.   METHYLPREDNISOLONE (MEDROL DOSEPAK) 4 MG TBPK TABLET    Taper over 6 days      Caitlin Chapel, MD 08/19/15 [id number]

## 2015-09-12 DIAGNOSIS — Z1231 Encounter for screening mammogram for malignant neoplasm of breast: Secondary | ICD-10-CM | POA: Diagnosis not present

## 2015-10-02 DIAGNOSIS — N63 Unspecified lump in breast: Secondary | ICD-10-CM | POA: Diagnosis not present

## 2015-10-15 ENCOUNTER — Encounter: Payer: Self-pay | Admitting: Pediatrics

## 2015-10-15 ENCOUNTER — Ambulatory Visit (INDEPENDENT_AMBULATORY_CARE_PROVIDER_SITE_OTHER): Payer: No Typology Code available for payment source | Admitting: Pediatrics

## 2015-10-15 VITALS — BP 120/70 | HR 76 | Temp 97.7°F | Resp 16 | Ht 65.0 in | Wt 191.6 lb

## 2015-10-15 DIAGNOSIS — J3089 Other allergic rhinitis: Secondary | ICD-10-CM | POA: Diagnosis not present

## 2015-10-15 DIAGNOSIS — E739 Lactose intolerance, unspecified: Secondary | ICD-10-CM | POA: Diagnosis not present

## 2015-10-15 DIAGNOSIS — K219 Gastro-esophageal reflux disease without esophagitis: Secondary | ICD-10-CM

## 2015-10-15 NOTE — Progress Notes (Signed)
Caitlin Kennedy [id number] Dept: [phone]  New Patient Note  Patient ID: Caitlin Kennedy, female    DOB: Jan 19, 1966  Age: 50 y.o. MRN: [id number] Date of Office Visit: 10/15/2015 Referring provider: Sharion Balloon, [address] [place], [place] [address]    Chief Complaint: Nasal Congestion  HPI Caitlin Kennedy presents for evaluation of nasal congestion since 2004. Her symptoms are perennial. She has aggravation of her nasal congestion on exposure to dust. She cannot tolerate any aqueous nasal sprays. She has never had asthmatic symptoms,or eczema or recurrent sinus infections. If she eats pork or dairy products she becomes nauseated. She has no other symptoms from foods. Since 1991 she has had right-sided abdominal pain which has been fully evaluated several times without a clear-cut reason. She has gastroesophageal reflux..  Review of Systems  Constitutional: Negative.   HENT:       Perennial nasal congestion since 2004. She cannot tolerate aqueous nasal sprays. She had a T&A and ventilation to use at age 77  Eyes: Negative.   Respiratory: Negative.   Cardiovascular:       History of heart murmur as a child which resolved  Gastrointestinal:       Gastroesophageal reflux. History of right sided abdominal pain since 1991 with normal evaluations  Genitourinary: Negative.   Musculoskeletal: Negative.   Skin: Negative.   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 History of migraine headaches. Pork and dairy products make her nauseated but no other allergic symptoms  Endo/Heme/Allergies:       No diabetes or thyroid disease  Psychiatric/Behavioral: Negative.     Outpatient Encounter Prescriptions as of 10/15/2015  Medication Sig  . acyclovir (ZOVIRAX) 800 MG tablet Take 800 mg by mouth 5 (five) times daily.  . bacitracin-polymyxin b (POLYSPORIN) ophthalmic ointment Place 1 application into the right eye every 12 (twelve) hours. apply  to eye every 12 hours while awake  . butalbital-acetaminophen-caffeine (FIORICET, ESGIC) 50-325-40 MG per tablet Take 1 tablet by mouth 2 (two) times daily as needed for headache or migraine.  . cholecalciferol (VITAMIN D) 400 units TABS tablet Take 400 Units by mouth.  . citalopram (CELEXA) 40 MG tablet Take 20 mg by mouth daily.  . fexofenadine (ALLEGRA) 180 MG tablet Take 180 mg by mouth daily.  Marland Kitchen ibuprofen (ADVIL,MOTRIN) 800 MG tablet Take 1 tablet (800 mg total) by mouth 3 (three) times daily.  . methocarbamol (ROBAXIN) 500 MG tablet Take 1 tablet (500 mg total) by mouth 2 (two) times daily as needed for muscle spasms.  . methylPREDNISolone (MEDROL DOSEPAK) 4 MG TBPK tablet Taper over 6 days  . montelukast (SINGULAIR) 10 MG tablet Take 10 mg by mouth daily.  . montelukast (SINGULAIR) 10 MG tablet Take 10 mg by mouth at bedtime.  . nortriptyline (PAMELOR) 10 MG capsule Take 10 mg by mouth at bedtime.   No facility-administered encounter medications on file as of 10/15/2015.      Drug Allergies:  Allergies  Allergen Reactions  . Codeine   . Hydrocodone Nausea And Vomiting    rash  . Lac Bovis Nausea And Vomiting  . Oxycodone Rash    Family History: Kahlyn's family history includes Allergic rhinitis in her sister; CAD in her other; Cancer in her mother and other; Diabetes in her other..Family history is negative for asthma, sinus problems, eczema, hives, food allergies, chronic bronchitis or emphysema.  Social and environmental. There are no pets in  the home. She quit smoking in 2006. She averaged less than a pack per week for about 5 years. She works doing Land work. She was in the [employer] in [place] and [place]  Physical Exam: BP 120/70 (BP Location: Left Arm, Patient Position: Sitting, Cuff Size: Normal)   Pulse 76   Temp 97.7 F (36.5 C) (Oral)   Resp 16   Ht 5\' 5"  (1.651 m)   Wt 191 lb 9.6 oz (86.9 kg)   LMP 06/16/2012   SpO2 98%   BMI 31.88 kg/m    Physical Exam    Constitutional: She is oriented to person, place, and time. She appears well-developed and well-nourished.  HENT:  Eyes normal. Ears normal. Nose mild swelling of nasal turbinates. Pharynx normal.  Neck: Neck supple. No thyromegaly present.  Cardiovascular:  S1 and S2 normal no murmurs  Pulmonary/Chest:  Clear to percussion auscultation  Abdominal: Soft. There is tenderness (mild tenderness in the mid right costal margin. No hepatosplenomegaly).  Lymphadenopathy:    She has no cervical adenopathy.  Neurological: She is alert and oriented to person, place, and time.  Skin:  Clear  Psychiatric: She has a normal mood and affect. Her behavior is normal. Judgment and thought content normal.  Vitals reviewed.   Diagnostics: Allergy skin tests were positive to some molds. Slight reactivity noted to dog. Skin testing to pork was negative   Assessment  Assessment and Plan: 1. Gastroesophageal reflux disease without esophagitis   2. Other allergic rhinitis   3. Lactose intolerance     No orders of the defined types were placed in this encounter.   Patient Instructions  Environmental control of dust and mold Fexofenadine 180 mg once a day for runny nose Montelukast  10 mg once a day Qnasl  80-2 sprays per nostril once a day for stuffy nose Add  prednisone 10 mg twice a day for 4 days, 10 mg on the fifth day to bring your allergic symptoms under control Call me if you are not doing better on this treatment plan  Handouts given for lactose intolerance. Avoid pork products   Return in about 6 weeks (around 11/26/2015).   Thank you for the opportunity to care for this patient.  Please do not hesitate to contact me with questions.  Penne Lash, M.D.  [facility] [address] [place], [place] [address] [phone]

## 2015-10-15 NOTE — Patient Instructions (Addendum)
Environmental control of dust and mold Fexofenadine 180 mg once a day for runny nose Montelukast  10 mg once a day Qnasl  80-2 sprays per nostril once a day for stuffy nose Add  prednisone 10 mg twice a day for 4 days, 10 mg on the fifth day to bring your allergic symptoms under control Call me if you are not doing better on this treatment plan  Handouts given for lactose intolerance. Avoid pork products

## 2015-10-16 DIAGNOSIS — J3089 Other allergic rhinitis: Secondary | ICD-10-CM | POA: Insufficient documentation

## 2015-10-16 DIAGNOSIS — K219 Gastro-esophageal reflux disease without esophagitis: Secondary | ICD-10-CM | POA: Insufficient documentation

## 2015-10-16 DIAGNOSIS — E739 Lactose intolerance, unspecified: Secondary | ICD-10-CM | POA: Insufficient documentation

## 2015-10-16 MED ORDER — MONTELUKAST SODIUM 10 MG PO TABS: 10.0000 mg | ORAL_TABLET | Freq: Every day | ORAL | 5 refills | Status: DC

## 2015-10-16 MED ORDER — BECLOMETHASONE DIPROPIONATE 80 MCG/ACT NA AERS: INHALATION_SPRAY | NASAL | 5 refills | Status: DC

## 2015-10-17 DIAGNOSIS — C50811 Malignant neoplasm of overlapping sites of right female breast: Secondary | ICD-10-CM | POA: Diagnosis not present

## 2015-10-17 DIAGNOSIS — C50911 Malignant neoplasm of unspecified site of right female breast: Secondary | ICD-10-CM | POA: Diagnosis not present

## 2015-10-18 DIAGNOSIS — C801 Malignant (primary) neoplasm, unspecified: Secondary | ICD-10-CM

## 2015-10-18 HISTORY — DX: Malignant (primary) neoplasm, unspecified: C80.1

## 2015-10-23 DIAGNOSIS — C50911 Malignant neoplasm of unspecified site of right female breast: Secondary | ICD-10-CM | POA: Diagnosis not present

## 2015-11-13 DIAGNOSIS — C50411 Malignant neoplasm of upper-outer quadrant of right female breast: Secondary | ICD-10-CM | POA: Insufficient documentation

## 2015-11-26 ENCOUNTER — Ambulatory Visit: Payer: No Typology Code available for payment source | Admitting: Pediatrics

## 2015-12-03 ENCOUNTER — Encounter: Payer: Self-pay | Admitting: Pediatrics

## 2015-12-03 ENCOUNTER — Ambulatory Visit (INDEPENDENT_AMBULATORY_CARE_PROVIDER_SITE_OTHER): Payer: No Typology Code available for payment source | Admitting: Pediatrics

## 2015-12-03 VITALS — BP 110/70 | HR 80 | Temp 98.1°F | Resp 16

## 2015-12-03 DIAGNOSIS — K219 Gastro-esophageal reflux disease without esophagitis: Secondary | ICD-10-CM | POA: Diagnosis not present

## 2015-12-03 DIAGNOSIS — J3089 Other allergic rhinitis: Secondary | ICD-10-CM

## 2015-12-03 MED ORDER — BECLOMETHASONE DIPROPIONATE 80 MCG/ACT NA AERS: INHALATION_SPRAY | NASAL | 5 refills | Status: DC

## 2015-12-04 ENCOUNTER — Encounter: Payer: Self-pay | Admitting: Pediatrics

## 2015-12-04 NOTE — Progress Notes (Signed)
  [place] [address] Dept: [phone]  FOLLOW UP NOTE  Patient ID: Caitlin Kennedy, female    DOB: 1965/09/05  Age: 50 y.o. MRN: [id number] Date of Office Visit: 12/03/2015  Assessment  Chief Complaint: Allergies (follow up visit )  HPI Caitlin Kennedy presents for follow-up of allergic rhinitis. She has never been able to tolerate aqueous nasal l sprays in any form. The aqueous sprays have included fluticasone, Nasonex, Nasacort and similar sprays. She has had dramatic improvement of her nasal congestion with the use of Qnasl 80-2 sprays per nostril once a day which is an aerosol. She was recently diagnosed with breast cancer  Current medications fexofenadine 180 mg once a day, montelukast 10 mg once a day, Qnasl 80-2 sprays per nostril once a day. Her other medications are outlined in the chart.   Drug Allergies:  Allergies  Allergen Reactions  . Codeine   . Hydrocodone Nausea And Vomiting    rash  . Lac Bovis Nausea And Vomiting  . Oxycodone Rash    Physical Exam: BP 110/70   Pulse 80   Temp 98.1 F (36.7 C) (Oral)   Resp 16   LMP 06/16/2012    Physical Exam  Constitutional: She is oriented to person, place, and time. She appears well-developed and well-nourished.  HENT:  Eyes normal. Ears normal. Nose normal. Pharynx normal.  Neck: Neck supple.  Cardiovascular:  S1 and S2 normal no murmurs  Pulmonary/Chest:  Clear to percussion auscultation  Lymphadenopathy:    She has no cervical adenopathy.  Neurological: She is alert and oriented to person, place, and time.  Psychiatric: She has a normal mood and affect. Her behavior is normal. Thought content normal.  Vitals reviewed.   Diagnostics:    Assessment and Plan: 1. Other allergic rhinitis   2. Gastroesophageal reflux disease without esophagitis   3.     Current breast cancer under evaluation  Meds ordered this encounter  Medications  . Beclomethasone Dipropionate (QNASL) 80 MCG/ACT  AERS    Sig: TWO SPRAYS EACH NOSTRIL ONCE A DAY FOR NASAL CONGESTION.    Dispense:  8.7 g    Refill:  5    Patient Instructions  Continue on your current medications.  I will contact the [facility] and get approval for Qnasl Call me if you're not doing better on this treatment plan   Return in about 6 months (around 06/02/2016).    Thank you for the opportunity to care for this patient.  Please do not hesitate to contact me with questions.  Penne Lash, M.D.  [facility] [address] [place], [place] [address] [phone]

## 2015-12-04 NOTE — Patient Instructions (Addendum)
Continue on your current medications.  I will contact the [facility] pharmacy and get approval for Qnasl Call me if you're not doing better on this treatment plan

## 2015-12-06 DIAGNOSIS — C50411 Malignant neoplasm of upper-outer quadrant of right female breast: Secondary | ICD-10-CM | POA: Diagnosis not present

## 2015-12-18 DIAGNOSIS — C50411 Malignant neoplasm of upper-outer quadrant of right female breast: Secondary | ICD-10-CM | POA: Diagnosis not present

## 2015-12-27 DIAGNOSIS — C50411 Malignant neoplasm of upper-outer quadrant of right female breast: Secondary | ICD-10-CM | POA: Diagnosis not present

## 2015-12-30 DIAGNOSIS — C50411 Malignant neoplasm of upper-outer quadrant of right female breast: Secondary | ICD-10-CM | POA: Diagnosis not present

## 2016-01-17 DIAGNOSIS — C50411 Malignant neoplasm of upper-outer quadrant of right female breast: Secondary | ICD-10-CM | POA: Diagnosis not present

## 2016-01-30 ENCOUNTER — Telehealth: Payer: Self-pay | Admitting: Pediatrics

## 2016-01-30 NOTE — Telephone Encounter (Signed)
Please call pt back regarding medication Q-Nasal. Pt stated that her pharmacy did not have this med for her on file.

## 2016-01-31 MED ORDER — BECLOMETHASONE DIPROPIONATE 80 MCG/ACT NA AERS: INHALATION_SPRAY | NASAL | 5 refills | Status: DC

## 2016-01-31 MED ORDER — MONTELUKAST SODIUM 10 MG PO TABS: 10.0000 mg | ORAL_TABLET | Freq: Every day | ORAL | 5 refills | Status: DC

## 2016-01-31 NOTE — Telephone Encounter (Signed)
Will print out a RX and mail it to pt. so she can send it to the [place]

## 2016-02-28 DIAGNOSIS — Z5111 Encounter for antineoplastic chemotherapy: Secondary | ICD-10-CM | POA: Diagnosis not present

## 2016-03-20 DIAGNOSIS — C50411 Malignant neoplasm of upper-outer quadrant of right female breast: Secondary | ICD-10-CM | POA: Diagnosis not present

## 2016-03-27 DIAGNOSIS — C50411 Malignant neoplasm of upper-outer quadrant of right female breast: Secondary | ICD-10-CM | POA: Diagnosis not present

## 2016-03-27 DIAGNOSIS — Z5111 Encounter for antineoplastic chemotherapy: Secondary | ICD-10-CM | POA: Diagnosis not present

## 2016-03-30 DIAGNOSIS — I427 Cardiomyopathy due to drug and external agent: Secondary | ICD-10-CM | POA: Diagnosis not present

## 2016-04-14 ENCOUNTER — Encounter: Payer: Self-pay | Admitting: Physician Assistant

## 2016-04-14 ENCOUNTER — Ambulatory Visit (INDEPENDENT_AMBULATORY_CARE_PROVIDER_SITE_OTHER): Payer: Federal, State, Local not specified - PPO | Admitting: Physician Assistant

## 2016-04-14 VITALS — BP 118/69 | HR 119 | Temp 97.8°F | Ht 65.0 in | Wt 182.8 lb

## 2016-04-14 DIAGNOSIS — J0100 Acute maxillary sinusitis, unspecified: Secondary | ICD-10-CM | POA: Diagnosis not present

## 2016-04-14 DIAGNOSIS — J40 Bronchitis, not specified as acute or chronic: Secondary | ICD-10-CM | POA: Diagnosis not present

## 2016-04-14 MED ORDER — DOXYCYCLINE HYCLATE 100 MG PO TABS: 100.0000 mg | ORAL_TABLET | Freq: Two times a day (BID) | ORAL | 0 refills | Status: DC

## 2016-04-14 MED ORDER — LEVOFLOXACIN 500 MG PO TABS: 500.0000 mg | ORAL_TABLET | Freq: Every day | ORAL | 0 refills | Status: DC

## 2016-04-14 MED ORDER — BENZONATATE 200 MG PO CAPS: 200.0000 mg | ORAL_CAPSULE | Freq: Three times a day (TID) | ORAL | 0 refills | Status: DC | PRN

## 2016-04-14 MED ORDER — PREDNISONE 10 MG (21) PO TBPK: ORAL_TABLET | ORAL | 0 refills | Status: DC

## 2016-04-14 NOTE — Progress Notes (Addendum)
BP 118/69   Pulse (!) 119   Temp 97.8 F (36.6 C) (Oral)   Ht 5\' 5"  (1.651 m)   Wt 182 lb 12.8 oz (82.9 kg)   LMP 06/16/2012   BMI 30.42 kg/m    Subjective:    Patient ID: Caitlin Kennedy, female    DOB: [birth year], 51 y.o.   MRN: [id number]  HPI: Caitlin Kennedy is a 51 y.o. female presenting on 04/14/2016 for Sinusitis and Cough  This patient has had many days of sinus headache and postnasal drainage. There is copious drainage at times. Denies any fever at this time. There has been a history of sinus infections in the past.  No history of sinus surgery. There is cough at night. It has become more prevalent in recent days.   Relevant past medical, surgical, family and social history reviewed and updated as indicated. Allergies and medications reviewed and updated.  Past Medical History:  Diagnosis Date  . Cancer ([place]) 10/2015   Breast   . Migraine    chronic  . PTSD (post-traumatic stress disorder)   . Sinus infection    chronic  . Subdural hematoma [facility])     Past Surgical History:  Procedure Laterality Date  . ADENOIDECTOMY    . TONSILLECTOMY    . TYMPANOSTOMY TUBE PLACEMENT      Review of Systems  Constitutional: Positive for chills and fatigue. Negative for activity change and appetite change.  HENT: Positive for congestion, postnasal drip and sore throat.   Eyes: Negative.   Respiratory: Positive for cough and wheezing.   Cardiovascular: Negative.  Negative for chest pain, palpitations and leg swelling.  Gastrointestinal: Negative.   Genitourinary: Negative.   Musculoskeletal: Negative.   Skin: Negative.   Neurological: Positive for headaches.    Allergies as of 04/14/2016      Reactions   Codeine    Hydrocodone Nausea And Vomiting   rash   Lac Bovis Nausea And Vomiting   Oxycodone Rash      Medication List       Accurate as of 04/14/16  9:12 PM. Always use your most recent med list.          acyclovir 800 MG tablet Commonly known as:   ZOVIRAX Take 800 mg by mouth 5 (five) times daily.   beclomethasone 42 MCG/SPRAY nasal spray Commonly known as:  BECONASE-AQ Place into the nose.   benzonatate 200 MG capsule Commonly known as:  TESSALON Take 1 capsule (200 mg total) by mouth 3 (three) times daily as needed for cough.   butalbital-acetaminophen-caffeine 50-325-40 MG tablet Commonly known as:  FIORICET, ESGIC Take 1 tablet by mouth 2 (two) times daily as needed for headache or migraine.   carvedilol 6.25 MG tablet Commonly known as:  COREG Take 3.125 mg by mouth 2 (two) times daily with a meal.   cholecalciferol 400 units Tabs tablet Commonly known as:  VITAMIN D Take 400 Units by mouth.   citalopram 40 MG tablet Commonly known as:  CELEXA Take 40 mg by mouth daily.   Cranberry Fruit 405 MG Caps Take 8,400 mg by mouth.   doxycycline 100 MG tablet Commonly known as:  VIBRA-TABS Take 1 tablet (100 mg total) by mouth 2 (two) times daily.   fexofenadine 180 MG tablet Commonly known as:  ALLEGRA Take by mouth.   ibuprofen 800 MG tablet Commonly known as:  ADVIL,MOTRIN Take 1 tablet (800 mg total) by mouth 3 (three) times daily.   levofloxacin 500 MG  tablet Commonly known as:  LEVAQUIN Take 1 tablet (500 mg total) by mouth daily.   lidocaine-prilocaine cream Commonly known as:  EMLA Please apply liberally to port site 1 hr before use.  Cover with transparent dressing   LORazepam 1 MG tablet Commonly known as:  ATIVAN Take 1 mg by mouth every 6 (six) hours as needed for anxiety.   Magnesium 250 MG Tabs Take 500 mg by mouth.   nortriptyline 10 MG capsule Commonly known as:  PAMELOR Take by mouth.   ondansetron 4 MG disintegrating tablet Commonly known as:  ZOFRAN-ODT Take 4 mg every 8 hrs as needed for nausea/vomiting   predniSONE 10 MG (21) Tbpk tablet Commonly known as:  STERAPRED UNI-PAK 21 TAB As directed x 6 days   prochlorperazine 10 MG tablet Commonly known as:  COMPAZINE Take 10  mg by mouth.   Vitamin-B Complex Tabs Take by mouth.          Objective:    BP 118/69   Pulse (!) 119   Temp 97.8 F (36.6 C) (Oral)   Ht 5\' 5"  (1.651 m)   Wt 182 lb 12.8 oz (82.9 kg)   LMP 06/16/2012   BMI 30.42 kg/m   Allergies  Allergen Reactions  . Codeine   . Hydrocodone Nausea And Vomiting    rash  . Lac Bovis Nausea And Vomiting  . Oxycodone Rash    Physical Exam  Constitutional: She is oriented to person, place, and time. She appears well-developed and well-nourished.  HENT:  Head: Normocephalic and atraumatic.  Right Ear: Tympanic membrane and external ear normal. No middle ear effusion.  Left Ear: Tympanic membrane and external ear normal.  No middle ear effusion.  Nose: Mucosal edema and rhinorrhea present. Right sinus exhibits no maxillary sinus tenderness. Left sinus exhibits no maxillary sinus tenderness.  Mouth/Throat: Uvula is midline. Posterior oropharyngeal erythema present.  Eyes: Conjunctivae and EOM are normal. Pupils are equal, round, and reactive to light. Right eye exhibits no discharge. Left eye exhibits no discharge.  Neck: Normal range of motion.  Cardiovascular: Normal rate, regular rhythm and normal heart sounds.   Pulmonary/Chest: Effort normal and breath sounds normal. No respiratory distress. She has no wheezes.  Abdominal: Soft.  Lymphadenopathy:    She has no cervical adenopathy.  Neurological: She is alert and oriented to person, place, and time.  Skin: Skin is warm and dry.  Psychiatric: She has a normal mood and affect.        Assessment & Plan:   1. Bronchitis - doxycycline (VIBRA-TABS) 100 MG tablet; Take 1 tablet (100 mg total) by mouth 2 (two) times daily.  Dispense: 20 tablet; Refill: 0 - predniSONE (STERAPRED UNI-PAK 21 TAB) 10 MG (21) TBPK tablet; As directed x 6 days  Dispense: 21 tablet; Refill: 0 - benzonatate (TESSALON) 200 MG capsule; Take 1 capsule (200 mg total) by mouth 3 (three) times daily as needed for  cough.  Dispense: 30 capsule; Refill: 0  2. Acute non-recurrent maxillary sinusitis - doxycycline (VIBRA-TABS) 100 MG tablet; Take 1 tablet (100 mg total) by mouth 2 (two) times daily.  Dispense: 20 tablet; Refill: 0 - predniSONE (STERAPRED UNI-PAK 21 TAB) 10 MG (21) TBPK tablet; As directed x 6 days  Dispense: 21 tablet; Refill: 0 - benzonatate (TESSALON) 200 MG capsule; Take 1 capsule (200 mg total) by mouth 3 (three) times daily as needed for cough.  Dispense: 30 capsule; Refill: 0   Continue all other maintenance medications as listed  above.  Follow up plan: Return if symptoms worsen or fail to improve.  Educational handout given for bronchitis   Terald Sleeper PA-C [place] [address]  [place], [place] [address] [phone]   04/14/2016, 9:12 PM

## 2016-04-14 NOTE — Patient Instructions (Signed)

## 2016-04-15 ENCOUNTER — Other Ambulatory Visit: Payer: Self-pay | Admitting: Physician Assistant

## 2016-04-15 DIAGNOSIS — C50811 Malignant neoplasm of overlapping sites of right female breast: Secondary | ICD-10-CM | POA: Diagnosis not present

## 2016-04-15 DIAGNOSIS — C50411 Malignant neoplasm of upper-outer quadrant of right female breast: Secondary | ICD-10-CM | POA: Diagnosis not present

## 2016-04-15 DIAGNOSIS — C50911 Malignant neoplasm of unspecified site of right female breast: Secondary | ICD-10-CM | POA: Diagnosis not present

## 2016-04-17 DIAGNOSIS — C50411 Malignant neoplasm of upper-outer quadrant of right female breast: Secondary | ICD-10-CM | POA: Diagnosis not present

## 2016-04-21 ENCOUNTER — Telehealth: Payer: Self-pay | Admitting: Physician Assistant

## 2016-04-21 MED ORDER — CEFDINIR 300 MG PO CAPS: 300.0000 mg | ORAL_CAPSULE | Freq: Two times a day (BID) | ORAL | 0 refills | Status: DC

## 2016-04-21 NOTE — Telephone Encounter (Signed)
Please call in omnicef 300 mg one BID 10 days

## 2016-04-21 NOTE — Telephone Encounter (Signed)
Patient aware that medication has been sent to the pharmacy 

## 2016-04-21 NOTE — Telephone Encounter (Signed)
Returned patient's phone call.   Patient states that she has finished the antibiotics and is not feeling any better.  Patient states that she is still having trouble sleeping at night due to the cough

## 2016-04-21 NOTE — Addendum Note (Signed)
Addended by: Wardell Heath on: 04/21/2016 03:22 PM   Modules accepted: Orders

## 2016-04-28 DIAGNOSIS — C50411 Malignant neoplasm of upper-outer quadrant of right female breast: Secondary | ICD-10-CM | POA: Diagnosis not present

## 2016-05-04 DIAGNOSIS — C50411 Malignant neoplasm of upper-outer quadrant of right female breast: Secondary | ICD-10-CM | POA: Diagnosis not present

## 2016-05-04 DIAGNOSIS — C50911 Malignant neoplasm of unspecified site of right female breast: Secondary | ICD-10-CM | POA: Diagnosis not present

## 2016-05-05 DIAGNOSIS — C50911 Malignant neoplasm of unspecified site of right female breast: Secondary | ICD-10-CM | POA: Diagnosis not present

## 2016-05-05 DIAGNOSIS — N62 Hypertrophy of breast: Secondary | ICD-10-CM | POA: Diagnosis not present

## 2016-05-05 DIAGNOSIS — Z9013 Acquired absence of bilateral breasts and nipples: Secondary | ICD-10-CM | POA: Insufficient documentation

## 2016-05-05 DIAGNOSIS — G8918 Other acute postprocedural pain: Secondary | ICD-10-CM | POA: Diagnosis not present

## 2016-05-06 DIAGNOSIS — C50411 Malignant neoplasm of upper-outer quadrant of right female breast: Secondary | ICD-10-CM | POA: Diagnosis not present

## 2016-05-15 DIAGNOSIS — Z5111 Encounter for antineoplastic chemotherapy: Secondary | ICD-10-CM | POA: Diagnosis not present

## 2016-05-15 DIAGNOSIS — C50411 Malignant neoplasm of upper-outer quadrant of right female breast: Secondary | ICD-10-CM | POA: Diagnosis not present

## 2016-06-02 ENCOUNTER — Ambulatory Visit (INDEPENDENT_AMBULATORY_CARE_PROVIDER_SITE_OTHER): Payer: No Typology Code available for payment source | Admitting: Pediatrics

## 2016-06-02 ENCOUNTER — Encounter: Payer: Self-pay | Admitting: Pediatrics

## 2016-06-02 VITALS — BP 142/78 | HR 85 | Temp 97.7°F | Resp 16

## 2016-06-02 DIAGNOSIS — E739 Lactose intolerance, unspecified: Secondary | ICD-10-CM | POA: Diagnosis not present

## 2016-06-02 DIAGNOSIS — J3089 Other allergic rhinitis: Secondary | ICD-10-CM

## 2016-06-02 DIAGNOSIS — I10 Essential (primary) hypertension: Secondary | ICD-10-CM

## 2016-06-02 NOTE — Patient Instructions (Addendum)
Continue on her current medications Call me if she is not doing well on this treatment plan

## 2016-06-02 NOTE — Progress Notes (Signed)
  [place] [id number] Dept: [phone]  FOLLOW UP NOTE  Patient ID: Caitlin Kennedy, female    DOB: 22-Apr-1965  Age: 51 y.o. MRN: [id number] Date of Office Visit: 06/02/2016  Assessment  Chief Complaint: Allergic Rhinitis  (doing well)  HPI Caitlin Kennedy presents for follow-up of allergic rhinitis. She had a double mastectomy followed by chemotherapy last year for breast cancer She still gets chemotherapy every 3 weeks. Her nasal congestion is under control.  Current medications are fexofenadine 180 mg once  Daily, Qnasl80 -2 sprays per nostril once a day, montelukast 10 mg once a day. Her other medications are outlined in the chart.   Drug Allergies:  Allergies  Allergen Reactions  . Codeine   . Hydrocodone Nausea And Vomiting    rash  . Lac Bovis Nausea And Vomiting  . Milk-Related Compounds Nausea And Vomiting  . Pork-Derived Products Nausea And Vomiting  . Tetracyclines & Related   . Oxycodone Rash    Physical Exam: BP (!) 142/78   Pulse 85   Temp 97.7 F (36.5 C) (Oral)   Resp 16   LMP 06/16/2012   SpO2 99%    Physical Exam  Constitutional: She is oriented to person, place, and time. She appears well-developed and well-nourished.  HENT:  Eyes normal. Ears normal. Nose normal. Pharynx normal.  Neck: Neck supple.  Cardiovascular:  S1 and S2 normal no murmurs  Pulmonary/Chest:  Clear to percussion and auscultation  Lymphadenopathy:    She has no cervical adenopathy.  Neurological: She is alert and oriented to person, place, and time.  Psychiatric: She has a normal mood and affect. Her behavior is normal. Judgment and thought content normal.  Vitals reviewed.   Diagnostics:    Assessment and Plan: 1. Other allergic rhinitis   2. Lactose intolerance   3. Essential hypertension   2.      Breast cancer with a double mastectomy followed by chemotherapy     Patient Instructions  Continue on her current medications Call me if she is  not doing well on this treatment plan   Return in about 1 year (around 06/02/2017).    Thank you for the opportunity to care for this patient.  Please do not hesitate to contact me with questions.  Penne Lash, M.D.  [facility] [address] [place],  [address] [phone]

## 2016-06-03 ENCOUNTER — Telehealth: Payer: Self-pay

## 2016-06-03 DIAGNOSIS — I10 Essential (primary) hypertension: Secondary | ICD-10-CM | POA: Insufficient documentation

## 2016-06-03 NOTE — Telephone Encounter (Signed)
I have faxed a visit request to the [place] on behalf of the patient. I have tried calling the [place] but could not get any one on the phone only voicemail's.  If I do not hear anything back by next week, I will try again.   Thanks

## 2016-06-05 DIAGNOSIS — C50411 Malignant neoplasm of upper-outer quadrant of right female breast: Secondary | ICD-10-CM | POA: Diagnosis not present

## 2016-06-05 DIAGNOSIS — Z5112 Encounter for antineoplastic immunotherapy: Secondary | ICD-10-CM | POA: Diagnosis not present

## 2016-06-08 ENCOUNTER — Encounter: Payer: Self-pay | Admitting: Pediatrics

## 2016-06-24 DIAGNOSIS — I10 Essential (primary) hypertension: Secondary | ICD-10-CM | POA: Diagnosis not present

## 2016-06-24 DIAGNOSIS — B009 Herpesviral infection, unspecified: Secondary | ICD-10-CM | POA: Diagnosis not present

## 2016-06-24 DIAGNOSIS — D649 Anemia, unspecified: Secondary | ICD-10-CM | POA: Diagnosis not present

## 2016-06-24 DIAGNOSIS — F4312 Post-traumatic stress disorder, chronic: Secondary | ICD-10-CM | POA: Diagnosis not present

## 2016-06-24 DIAGNOSIS — D0511 Intraductal carcinoma in situ of right breast: Secondary | ICD-10-CM | POA: Diagnosis not present

## 2016-06-26 DIAGNOSIS — C50411 Malignant neoplasm of upper-outer quadrant of right female breast: Secondary | ICD-10-CM | POA: Diagnosis not present

## 2016-06-26 DIAGNOSIS — Z5112 Encounter for antineoplastic immunotherapy: Secondary | ICD-10-CM | POA: Diagnosis not present

## 2016-07-01 DIAGNOSIS — Z08 Encounter for follow-up examination after completed treatment for malignant neoplasm: Secondary | ICD-10-CM | POA: Diagnosis not present

## 2016-07-16 DIAGNOSIS — H524 Presbyopia: Secondary | ICD-10-CM | POA: Diagnosis not present

## 2016-07-16 DIAGNOSIS — I10 Essential (primary) hypertension: Secondary | ICD-10-CM | POA: Diagnosis not present

## 2016-07-16 DIAGNOSIS — Z853 Personal history of malignant neoplasm of breast: Secondary | ICD-10-CM | POA: Diagnosis not present

## 2016-07-16 DIAGNOSIS — Z9221 Personal history of antineoplastic chemotherapy: Secondary | ICD-10-CM | POA: Diagnosis not present

## 2016-07-16 DIAGNOSIS — Z9011 Acquired absence of right breast and nipple: Secondary | ICD-10-CM | POA: Diagnosis not present

## 2016-07-17 DIAGNOSIS — Z5111 Encounter for antineoplastic chemotherapy: Secondary | ICD-10-CM | POA: Diagnosis not present

## 2016-07-17 DIAGNOSIS — C50411 Malignant neoplasm of upper-outer quadrant of right female breast: Secondary | ICD-10-CM | POA: Diagnosis not present

## 2016-07-20 DIAGNOSIS — C50411 Malignant neoplasm of upper-outer quadrant of right female breast: Secondary | ICD-10-CM | POA: Diagnosis not present

## 2016-07-31 DIAGNOSIS — I5022 Chronic systolic (congestive) heart failure: Secondary | ICD-10-CM | POA: Diagnosis not present

## 2016-08-03 DIAGNOSIS — H524 Presbyopia: Secondary | ICD-10-CM | POA: Diagnosis not present

## 2016-08-07 DIAGNOSIS — Z5111 Encounter for antineoplastic chemotherapy: Secondary | ICD-10-CM | POA: Diagnosis not present

## 2016-08-07 DIAGNOSIS — C50411 Malignant neoplasm of upper-outer quadrant of right female breast: Secondary | ICD-10-CM | POA: Diagnosis not present

## 2016-08-21 DIAGNOSIS — I5022 Chronic systolic (congestive) heart failure: Secondary | ICD-10-CM | POA: Diagnosis not present

## 2016-09-18 DIAGNOSIS — C50411 Malignant neoplasm of upper-outer quadrant of right female breast: Secondary | ICD-10-CM | POA: Diagnosis not present

## 2016-09-18 DIAGNOSIS — Z5111 Encounter for antineoplastic chemotherapy: Secondary | ICD-10-CM | POA: Diagnosis not present

## 2016-09-30 ENCOUNTER — Telehealth: Payer: Self-pay | Admitting: Pediatrics

## 2016-09-30 NOTE — Telephone Encounter (Signed)
FAXED [facility] - 10/11/2016

## 2016-10-01 ENCOUNTER — Encounter: Payer: No Typology Code available for payment source | Admitting: *Deleted

## 2016-10-09 DIAGNOSIS — Z5111 Encounter for antineoplastic chemotherapy: Secondary | ICD-10-CM | POA: Diagnosis not present

## 2016-11-20 DIAGNOSIS — C50911 Malignant neoplasm of unspecified site of right female breast: Secondary | ICD-10-CM | POA: Diagnosis not present

## 2016-12-03 NOTE — Telephone Encounter (Signed)
Re-faxed request to [facility] on 11/06/2016

## 2016-12-11 DIAGNOSIS — Z5111 Encounter for antineoplastic chemotherapy: Secondary | ICD-10-CM | POA: Diagnosis not present

## 2017-02-03 DIAGNOSIS — D509 Iron deficiency anemia, unspecified: Secondary | ICD-10-CM | POA: Diagnosis not present

## 2017-02-03 DIAGNOSIS — B351 Tinea unguium: Secondary | ICD-10-CM | POA: Diagnosis not present

## 2017-02-19 DIAGNOSIS — C50411 Malignant neoplasm of upper-outer quadrant of right female breast: Secondary | ICD-10-CM | POA: Diagnosis not present

## 2017-03-11 DIAGNOSIS — D509 Iron deficiency anemia, unspecified: Secondary | ICD-10-CM | POA: Diagnosis not present

## 2017-03-11 DIAGNOSIS — Z1211 Encounter for screening for malignant neoplasm of colon: Secondary | ICD-10-CM | POA: Diagnosis not present

## 2017-03-11 DIAGNOSIS — D649 Anemia, unspecified: Secondary | ICD-10-CM | POA: Diagnosis not present

## 2017-03-11 DIAGNOSIS — K2950 Unspecified chronic gastritis without bleeding: Secondary | ICD-10-CM | POA: Diagnosis not present

## 2017-03-11 DIAGNOSIS — K2980 Duodenitis without bleeding: Secondary | ICD-10-CM | POA: Diagnosis not present

## 2017-05-21 DIAGNOSIS — Z170 Estrogen receptor positive status [ER+]: Secondary | ICD-10-CM | POA: Diagnosis not present

## 2017-05-21 DIAGNOSIS — C50411 Malignant neoplasm of upper-outer quadrant of right female breast: Secondary | ICD-10-CM | POA: Diagnosis not present

## 2017-05-21 DIAGNOSIS — Z452 Encounter for adjustment and management of vascular access device: Secondary | ICD-10-CM | POA: Diagnosis not present

## 2017-05-21 DIAGNOSIS — D696 Thrombocytopenia, unspecified: Secondary | ICD-10-CM | POA: Diagnosis not present

## 2017-05-21 DIAGNOSIS — Z95828 Presence of other vascular implants and grafts: Secondary | ICD-10-CM | POA: Diagnosis not present

## 2017-05-31 ENCOUNTER — Ambulatory Visit: Payer: No Typology Code available for payment source | Admitting: Pediatrics

## 2017-07-15 ENCOUNTER — Ambulatory Visit: Payer: Federal, State, Local not specified - PPO | Admitting: Family

## 2017-07-15 ENCOUNTER — Encounter: Payer: Self-pay | Admitting: Family

## 2017-07-15 VITALS — BP 113/73 | HR 80 | Temp 97.3°F | Ht 65.0 in | Wt 182.2 lb

## 2017-07-15 DIAGNOSIS — S30860A Insect bite (nonvenomous) of lower back and pelvis, initial encounter: Secondary | ICD-10-CM | POA: Diagnosis not present

## 2017-07-15 DIAGNOSIS — W57XXXA Bitten or stung by nonvenomous insect and other nonvenomous arthropods, initial encounter: Secondary | ICD-10-CM | POA: Diagnosis not present

## 2017-07-15 MED ORDER — SULFAMETHOXAZOLE-TRIMETHOPRIM 800-160 MG PO TABS: 1.0000 | ORAL_TABLET | Freq: Two times a day (BID) | ORAL | 0 refills | Status: DC

## 2017-07-15 NOTE — Progress Notes (Signed)
   Subjective:    Patient ID: Caitlin Kennedy, female    DOB: Jun 22, 1965, 52 y.o.   MRN: [id number]  Chief Complaint  Patient presents with  . insect bite with pain and redness    HPI Pt presents to the office today for a rash that she noticed about four days ago on her left waist. States the area is becoming larger, erythemas, and painful. States she can not remember getting bite by anything, but feels like this is an insect bite. She has tried antibiotic ointment and  peroxide with no relief.    Review of Systems     Objective:   Physical Exam  Constitutional: She is oriented to person, place, and time. She appears well-developed and well-nourished. No distress.  HENT:  Head: Normocephalic and atraumatic.  Eyes: Pupils are equal, round, and reactive to light.  Neck: Normal range of motion. Neck supple. No thyromegaly present.  Cardiovascular: Normal rate, regular rhythm, normal heart sounds and intact distal pulses.  No murmur heard. Pulmonary/Chest: Effort normal and breath sounds normal. No respiratory distress. She has no wheezes.  Abdominal: Soft. Bowel sounds are normal. She exhibits no distension. There is no tenderness.  Musculoskeletal: Normal range of motion. She exhibits no edema or tenderness.  Neurological: She is alert and oriented to person, place, and time. She has normal reflexes. No cranial nerve deficit.  Skin: Skin is warm and dry. There is erythema.  5.2X4.5 cm erythemas lesion, tender, warmth  Psychiatric: She has a normal mood and affect. Her behavior is normal. Judgment and thought content normal.  Vitals reviewed.       BP 113/73   Pulse 80   Temp (!) 97.3 F (36.3 C) (Oral)   Ht 5\' 5"  (1.651 m)   Wt 182 lb 3.2 oz (82.6 kg)   LMP 06/16/2012   BMI 30.32 kg/m      Assessment & Plan:  Caitlin Kennedy was seen today for insect bite with pain and redness.  Diagnoses and all orders for this visit:  Insect bite of lower back, initial encounter -      sulfamethoxazole-trimethoprim (BACTRIM DS) 800-160 MG tablet; Take 1 tablet by mouth 2 (two) times daily.   Warm Compresses Area marked, if erythemas increases call office Start Bactrim today RTO if symptoms worsen or do not improve  Evelina Dun, FNP

## 2017-07-15 NOTE — Patient Instructions (Signed)
Insect Bite, Adult An insect bite can make your skin red, itchy, and swollen. An insect bite is different from an insect sting, which happens when an insect injects poison (venom) into the skin. Some insects can spread disease to people through a bite. However, most insect bites do not lead to disease and are not serious. What are the causes? Insects may bite for a variety of reasons, including:  Hunger.  To defend themselves.  Insects that bite include:  Spiders.  Mosquitoes.  Ticks.  Fleas.  Ants.  Flies.  Bedbugs.  What are the signs or symptoms? Symptoms of this condition include:  Itching or pain in the bite area.  Redness and swelling in the bite area.  An open wound (skin ulcer).  In many cases, symptoms last for 2-4 days. How is this diagnosed? This condition is usually diagnosed based on symptoms and a physical exam. How is this treated? Treatment is usually not needed. Symptoms often go away on their own. When treatment is recommended, it may involve:  Applying a cream or lotion to the bitten area. This treatment helps with itching.  Taking an antibiotic medicine. This treatment is needed if the bite area gets infected.  Getting a tetanus shot.  Applying ice to the affected area.  Medicines called antihistamines. This treatment is needed if you develop an allergic reaction to the insect bite.  Follow these instructions at home: Bite area care  Do not scratch the bite area.  Keep the bite area clean and dry. Wash it every day with soap and water as told by your health care provider.  Check the bite area every day for signs of infection. Check for: ? More redness, swelling, or pain. ? Fluid or blood. ? Warmth. ? Pus. Managing pain, itching, and swelling   You may apply a baking soda paste, cortisone cream, or calamine lotion to the bite area as told by your health care provider.  If directed, applyice to the bite area. ? Put ice in a  plastic bag. ? Place a towel between your skin and the bag. ? Leave the ice on for 20 minutes, 2-3 times per day. Medicines  Apply or take over-the-counter and prescription medicines only as told by your health care provider.  If you were prescribed an antibiotic medicine, use it as told by your health care provider. Do not stop using the antibiotic even if your condition improves. General instructions  Keep all follow-up visits as told by your health care provider. This is important. How is this prevented? To help reduce your risk of insect bites:  When you are outdoors, wear clothing that covers your arms and legs.  Use insect repellent. The best insect repellents contain: ? DEET, picaridin, oil of lemon eucalyptus (OLE), or IR3535. ? Higher amounts of an active ingredient.  If your home windows do not have screens, consider installing them.  Contact a health care provider if:  You have more redness, swelling, or pain in the bite area.  You have fluid, blood, or pus coming from the bite area.  The bite area feels warm to the touch.  You have a fever. Get help right away if:  You have joint pain.  You have a rash.  You have shortness of breath.  You feel unusually tired or sleepy.  You have neck pain.  You have a headache.  You have unusual weakness.  You have chest pain.  You have nausea, vomiting, or pain in the abdomen. This   information is not intended to replace advice given to you by your health care provider. Make sure you discuss any questions you have with your health care provider. Document Released: 03/12/2004 Document Revised: 10/02/2015 Document Reviewed: 08/12/2015 Elsevier Interactive Patient Education  2018 Elsevier Inc.  

## 2017-07-22 ENCOUNTER — Telehealth: Payer: Self-pay | Admitting: Family

## 2017-07-22 NOTE — Telephone Encounter (Signed)
Pt aware, will call if any changes

## 2017-07-22 NOTE — Telephone Encounter (Signed)
Some redness may be ok. Make sure the area does not become worse and it continues to heal. If it does not improve she will need to be seen.

## 2017-07-26 ENCOUNTER — Telehealth: Payer: Self-pay | Admitting: Family

## 2017-07-26 NOTE — Telephone Encounter (Signed)
Apt made with [facility] 6/11

## 2017-07-27 ENCOUNTER — Ambulatory Visit: Payer: No Typology Code available for payment source | Admitting: Family

## 2017-07-27 ENCOUNTER — Encounter: Payer: Self-pay | Admitting: Family

## 2017-07-27 VITALS — BP 110/62 | HR 72 | Temp 96.9°F | Ht 65.0 in | Wt 185.8 lb

## 2017-07-27 DIAGNOSIS — S30860D Insect bite (nonvenomous) of lower back and pelvis, subsequent encounter: Secondary | ICD-10-CM

## 2017-07-27 DIAGNOSIS — W57XXXD Bitten or stung by nonvenomous insect and other nonvenomous arthropods, subsequent encounter: Secondary | ICD-10-CM

## 2017-07-27 MED ORDER — SULFAMETHOXAZOLE-TRIMETHOPRIM 800-160 MG PO TABS: 1.0000 | ORAL_TABLET | Freq: Two times a day (BID) | ORAL | 0 refills | Status: DC

## 2017-07-27 NOTE — Progress Notes (Signed)
   Subjective:    Patient ID: Elenore Rota, female    DOB: [birth year], 52 y.o.   MRN: [id number]  Chief Complaint  Patient presents with  . recheck bug bite    HPI PT presents to the office today recheck bug bite. PT was seen on 07/15/17 and started on Bactrim which she completed on 07/22/17. She reports the area looked better, but is still there. She states it is now the size of dime.   She reports the area is still erythemas and tender.    Review of Systems     Objective:   Physical Exam  Constitutional: She is oriented to person, place, and time. She appears well-developed and well-nourished. No distress.  HENT:  Head: Normocephalic and atraumatic.  Eyes: Pupils are equal, round, and reactive to light.  Neck: Normal range of motion. Neck supple. No thyromegaly present.  Cardiovascular: Normal rate, regular rhythm, normal heart sounds and intact distal pulses.  No murmur heard. Pulmonary/Chest: Effort normal and breath sounds normal. No respiratory distress. She has no wheezes.  Abdominal: Soft. Bowel sounds are normal. She exhibits no distension. There is no tenderness.  Musculoskeletal: Normal range of motion. She exhibits no edema or tenderness.  Neurological: She is alert and oriented to person, place, and time. She has normal reflexes. No cranial nerve deficit.  Skin: Skin is warm and dry. There is erythema.  3.3X3.3 erythemas hard nodule on lower left back   Psychiatric: She has a normal mood and affect. Her behavior is normal. Judgment and thought content normal.  Vitals reviewed.       BP 110/62   Pulse 72   Temp (!) 96.9 F (36.1 C) (Oral)   Ht 5\' 5"  (1.651 m)   Wt 185 lb 12.8 oz (84.3 kg)   LMP 06/16/2012   BMI 30.92 kg/m      Assessment & Plan:  Talajah Slimp comes in today with chief complaint of recheck bug bite   Diagnosis and orders addressed:  1. Insect bite of lower back, subsequent encounter Do not scratch or pick at Will go ahead and  give another 7 days of bactrim since area has greatly improved, but still slightly tender and hard RTO if area does not improve or worsens - sulfamethoxazole-trimethoprim (BACTRIM DS) 800-160 MG tablet; Take 1 tablet by mouth 2 (two) times daily.  Dispense: 14 tablet; Refill: 0   Evelina Dun, FNP

## 2017-07-27 NOTE — Patient Instructions (Signed)
Insect Bite, Adult An insect bite can make your skin red, itchy, and swollen. Some insects can spread disease to people with a bite. However, most insect bites do not lead to disease, and most are not serious. Follow these instructions at home: Bite area care  Do not scratch the bite area.  Keep the bite area clean and dry.  Wash the bite area every day with soap and water as told by your doctor.  Check the bite area every day for signs of infection. Check for: ? More redness, swelling, or pain. ? Fluid or blood. ? Warmth. ? Pus. Managing pain, itching, and swelling  You may put any of these on the bite area as told by your doctor: ? A baking soda paste. ? Cortisone cream. ? Calamine lotion.  If directed, put ice on the bite area. ? Put ice in a plastic bag. ? Place a towel between your skin and the bag. ? Leave the ice on for 20 minutes, 2-3 times a day. Medicines  Take medicines or put medicines on your skin only as told by your doctor.  If you were prescribed an antibiotic medicine, use it as told by your doctor. Do not stop using the antibiotic even if your condition improves. General instructions  Keep all follow-up visits as told by your doctor. This is important. How is this prevented? To help you have a lower risk of insect bites:  When you are outside, wear clothing that covers your arms and legs.  Use insect repellent. The best insect repellents have: ? An active ingredient of DEET, picaridin, oil of lemon eucalyptus (OLE), or IR3535. ? Higher amounts of DEET or another active ingredient than other repellents have.  If your home windows do not have screens, think about putting some in.  Contact a doctor if:  You have more redness, swelling, or pain in the bite area.  You have fluid, blood, or pus coming from the bite area.  The bite area feels warm.  You have a fever. Get help right away if:  You have joint pain.  You have a rash.  You have  shortness of breath.  You feel more tired or sleepy than you normally do.  You have neck pain.  You have a headache.  You feel weaker than you normally do.  You have chest pain.  You have pain in your belly.  You feel sick to your stomach (nauseous) or you throw up (vomit). Summary  An insect bite can make your skin red, itchy, and swollen.  Do not scratch the bite area, and keep it clean and dry.  Ice can help with pain and itching from the bite. This information is not intended to replace advice given to you by your health care provider. Make sure you discuss any questions you have with your health care provider. Document Released: 01/31/2000 Document Revised: 09/05/2015 Document Reviewed: 06/20/2014 Elsevier Interactive Patient Education  2018 Elsevier Inc.  

## 2017-09-02 DIAGNOSIS — F424 Excoriation (skin-picking) disorder: Secondary | ICD-10-CM | POA: Diagnosis not present

## 2017-09-02 DIAGNOSIS — D0511 Intraductal carcinoma in situ of right breast: Secondary | ICD-10-CM | POA: Diagnosis not present

## 2017-09-02 DIAGNOSIS — Z0001 Encounter for general adult medical examination with abnormal findings: Secondary | ICD-10-CM | POA: Diagnosis not present

## 2017-09-02 DIAGNOSIS — I10 Essential (primary) hypertension: Secondary | ICD-10-CM | POA: Diagnosis not present

## 2017-10-29 DIAGNOSIS — R072 Precordial pain: Secondary | ICD-10-CM | POA: Diagnosis not present

## 2017-11-22 DIAGNOSIS — Z170 Estrogen receptor positive status [ER+]: Secondary | ICD-10-CM | POA: Diagnosis not present

## 2017-11-22 DIAGNOSIS — Z79810 Long term (current) use of selective estrogen receptor modulators (SERMs): Secondary | ICD-10-CM | POA: Diagnosis not present

## 2017-11-22 DIAGNOSIS — D696 Thrombocytopenia, unspecified: Secondary | ICD-10-CM | POA: Diagnosis not present

## 2017-11-22 DIAGNOSIS — C50411 Malignant neoplasm of upper-outer quadrant of right female breast: Secondary | ICD-10-CM | POA: Diagnosis not present

## 2018-01-06 DIAGNOSIS — R079 Chest pain, unspecified: Secondary | ICD-10-CM | POA: Diagnosis not present

## 2018-01-06 DIAGNOSIS — F17210 Nicotine dependence, cigarettes, uncomplicated: Secondary | ICD-10-CM | POA: Diagnosis not present

## 2018-01-06 DIAGNOSIS — I10 Essential (primary) hypertension: Secondary | ICD-10-CM | POA: Diagnosis not present

## 2018-01-06 DIAGNOSIS — E782 Mixed hyperlipidemia: Secondary | ICD-10-CM | POA: Diagnosis not present

## 2018-01-20 DIAGNOSIS — L981 Factitial dermatitis: Secondary | ICD-10-CM | POA: Diagnosis not present

## 2018-01-20 DIAGNOSIS — G4700 Insomnia, unspecified: Secondary | ICD-10-CM | POA: Diagnosis not present

## 2018-01-20 DIAGNOSIS — B009 Herpesviral infection, unspecified: Secondary | ICD-10-CM | POA: Diagnosis not present

## 2018-01-20 DIAGNOSIS — S62634A Displaced fracture of distal phalanx of right ring finger, initial encounter for closed fracture: Secondary | ICD-10-CM | POA: Diagnosis not present

## 2018-01-20 DIAGNOSIS — L299 Pruritus, unspecified: Secondary | ICD-10-CM | POA: Diagnosis not present

## 2018-01-20 DIAGNOSIS — Z1151 Encounter for screening for human papillomavirus (HPV): Secondary | ICD-10-CM | POA: Diagnosis not present

## 2018-01-26 DIAGNOSIS — Z72821 Inadequate sleep hygiene: Secondary | ICD-10-CM | POA: Diagnosis not present

## 2018-02-03 DIAGNOSIS — M79644 Pain in right finger(s): Secondary | ICD-10-CM | POA: Diagnosis not present

## 2018-02-07 DIAGNOSIS — S62662D Nondisplaced fracture of distal phalanx of right middle finger, subsequent encounter for fracture with routine healing: Secondary | ICD-10-CM | POA: Diagnosis not present

## 2018-02-07 DIAGNOSIS — X58XXXD Exposure to other specified factors, subsequent encounter: Secondary | ICD-10-CM | POA: Diagnosis not present

## 2018-02-07 DIAGNOSIS — M79644 Pain in right finger(s): Secondary | ICD-10-CM | POA: Diagnosis not present

## 2018-02-07 DIAGNOSIS — S62632A Displaced fracture of distal phalanx of right middle finger, initial encounter for closed fracture: Secondary | ICD-10-CM | POA: Diagnosis not present

## 2018-03-10 DIAGNOSIS — S62662G Nondisplaced fracture of distal phalanx of right middle finger, subsequent encounter for fracture with delayed healing: Secondary | ICD-10-CM | POA: Diagnosis not present

## 2018-03-10 DIAGNOSIS — S62632D Displaced fracture of distal phalanx of right middle finger, subsequent encounter for fracture with routine healing: Secondary | ICD-10-CM | POA: Diagnosis not present

## 2018-03-10 DIAGNOSIS — X58XXXD Exposure to other specified factors, subsequent encounter: Secondary | ICD-10-CM | POA: Diagnosis not present

## 2018-03-10 DIAGNOSIS — I10 Essential (primary) hypertension: Secondary | ICD-10-CM | POA: Diagnosis not present

## 2018-04-05 DIAGNOSIS — E663 Overweight: Secondary | ICD-10-CM | POA: Diagnosis not present

## 2018-04-05 DIAGNOSIS — I10 Essential (primary) hypertension: Secondary | ICD-10-CM | POA: Diagnosis not present

## 2018-04-05 DIAGNOSIS — G4700 Insomnia, unspecified: Secondary | ICD-10-CM | POA: Diagnosis not present

## 2018-04-05 DIAGNOSIS — R0683 Snoring: Secondary | ICD-10-CM | POA: Diagnosis not present

## 2018-04-21 DIAGNOSIS — I10 Essential (primary) hypertension: Secondary | ICD-10-CM | POA: Diagnosis not present

## 2018-04-21 DIAGNOSIS — X58XXXD Exposure to other specified factors, subsequent encounter: Secondary | ICD-10-CM | POA: Diagnosis not present

## 2018-04-21 DIAGNOSIS — S62632D Displaced fracture of distal phalanx of right middle finger, subsequent encounter for fracture with routine healing: Secondary | ICD-10-CM | POA: Diagnosis not present

## 2018-07-18 DIAGNOSIS — Z170 Estrogen receptor positive status [ER+]: Secondary | ICD-10-CM | POA: Diagnosis not present

## 2018-07-18 DIAGNOSIS — C50411 Malignant neoplasm of upper-outer quadrant of right female breast: Secondary | ICD-10-CM | POA: Diagnosis not present

## 2018-07-18 DIAGNOSIS — Z9013 Acquired absence of bilateral breasts and nipples: Secondary | ICD-10-CM | POA: Diagnosis not present

## 2018-07-18 DIAGNOSIS — Z79810 Long term (current) use of selective estrogen receptor modulators (SERMs): Secondary | ICD-10-CM | POA: Diagnosis not present

## 2018-07-19 DIAGNOSIS — Z1211 Encounter for screening for malignant neoplasm of colon: Secondary | ICD-10-CM | POA: Diagnosis not present

## 2018-08-05 ENCOUNTER — Telehealth: Payer: Self-pay | Admitting: Family

## 2018-09-07 DIAGNOSIS — I10 Essential (primary) hypertension: Secondary | ICD-10-CM | POA: Diagnosis not present

## 2018-09-07 DIAGNOSIS — D0511 Intraductal carcinoma in situ of right breast: Secondary | ICD-10-CM | POA: Diagnosis not present

## 2018-09-07 DIAGNOSIS — E663 Overweight: Secondary | ICD-10-CM | POA: Diagnosis not present

## 2018-09-07 DIAGNOSIS — Z0000 Encounter for general adult medical examination without abnormal findings: Secondary | ICD-10-CM | POA: Diagnosis not present

## 2018-09-07 DIAGNOSIS — E782 Mixed hyperlipidemia: Secondary | ICD-10-CM | POA: Diagnosis not present

## 2018-09-07 DIAGNOSIS — Z79899 Other long term (current) drug therapy: Secondary | ICD-10-CM | POA: Diagnosis not present

## 2018-09-07 DIAGNOSIS — B009 Herpesviral infection, unspecified: Secondary | ICD-10-CM | POA: Diagnosis not present

## 2018-11-04 DIAGNOSIS — Z79899 Other long term (current) drug therapy: Secondary | ICD-10-CM | POA: Diagnosis not present

## 2018-11-04 DIAGNOSIS — Z8679 Personal history of other diseases of the circulatory system: Secondary | ICD-10-CM | POA: Diagnosis not present

## 2018-11-04 DIAGNOSIS — Z87891 Personal history of nicotine dependence: Secondary | ICD-10-CM | POA: Diagnosis not present

## 2018-11-04 DIAGNOSIS — Z9222 Personal history of monoclonal drug therapy: Secondary | ICD-10-CM | POA: Diagnosis not present

## 2018-11-04 DIAGNOSIS — Z5181 Encounter for therapeutic drug level monitoring: Secondary | ICD-10-CM | POA: Diagnosis not present

## 2018-11-04 DIAGNOSIS — Z9221 Personal history of antineoplastic chemotherapy: Secondary | ICD-10-CM | POA: Diagnosis not present

## 2018-12-07 DIAGNOSIS — Z79899 Other long term (current) drug therapy: Secondary | ICD-10-CM | POA: Diagnosis not present

## 2018-12-07 DIAGNOSIS — Z5181 Encounter for therapeutic drug level monitoring: Secondary | ICD-10-CM | POA: Diagnosis not present

## 2019-01-04 ENCOUNTER — Other Ambulatory Visit: Payer: Self-pay

## 2019-01-04 ENCOUNTER — Ambulatory Visit (INDEPENDENT_AMBULATORY_CARE_PROVIDER_SITE_OTHER): Payer: Federal, State, Local not specified - PPO

## 2019-01-04 ENCOUNTER — Ambulatory Visit (INDEPENDENT_AMBULATORY_CARE_PROVIDER_SITE_OTHER): Payer: Federal, State, Local not specified - PPO | Admitting: Family

## 2019-01-04 ENCOUNTER — Encounter: Payer: Self-pay | Admitting: Family

## 2019-01-04 VITALS — BP 98/78 | HR 89 | Temp 98.6°F | Ht 65.0 in

## 2019-01-04 DIAGNOSIS — M79672 Pain in left foot: Secondary | ICD-10-CM

## 2019-01-04 DIAGNOSIS — S93402A Sprain of unspecified ligament of left ankle, initial encounter: Secondary | ICD-10-CM

## 2019-01-04 DIAGNOSIS — S99922A Unspecified injury of left foot, initial encounter: Secondary | ICD-10-CM | POA: Diagnosis not present

## 2019-01-04 DIAGNOSIS — W108XXA Fall (on) (from) other stairs and steps, initial encounter: Secondary | ICD-10-CM | POA: Diagnosis not present

## 2019-01-04 MED ORDER — IBUPROFEN 800 MG PO TABS: 800.0000 mg | ORAL_TABLET | Freq: Three times a day (TID) | ORAL | 2 refills | Status: AC

## 2019-01-04 NOTE — Progress Notes (Signed)
Subjective:    Patient ID: Caitlin Kennedy, female    DOB: [birth year], 53 y.o.   MRN: [id number]  Chief Complaint  Patient presents with  . Foot Pain    fell down stairs this morning left foot pain    Foot Pain Associated symptoms include joint swelling and numbness. Pertinent negatives include no fever.  Fall The accident occurred 3 to 6 hours ago. Fall occurred: walking down the stairs. She landed on concrete. There was no blood loss. Point of impact: left ankle. The pain is present in the left foot. The pain is at a severity of 10/10. The pain is moderate. The symptoms are aggravated by standing and movement. Associated symptoms include numbness and tingling. Pertinent negatives include no fever, hematuria or loss of consciousness. She has tried acetaminophen and elevation for the symptoms. The treatment provided mild relief.      Review of Systems  Constitutional: Negative for fever.  Genitourinary: Negative for hematuria.  Musculoskeletal: Positive for joint swelling.  Neurological: Positive for tingling and numbness. Negative for loss of consciousness.  All other systems reviewed and are negative.      Objective:   Physical Exam Vitals signs reviewed.  Constitutional:      General: She is not in acute distress.    Appearance: She is well-developed.  HENT:     Head: Normocephalic and atraumatic.  Eyes:     Pupils: Pupils are equal, round, and reactive to light.  Neck:     Musculoskeletal: Normal range of motion and neck supple.     Thyroid: No thyromegaly.  Cardiovascular:     Rate and Rhythm: Normal rate and regular rhythm.     Heart sounds: Normal heart sounds. No murmur.  Pulmonary:     Effort: Pulmonary effort is normal. No respiratory distress.     Breath sounds: Normal breath sounds. No wheezing.  Abdominal:     General: Bowel sounds are normal. There is no distension.     Palpations: Abdomen is soft.     Tenderness: There is no abdominal tenderness.   Musculoskeletal:        General: Swelling and tenderness present.     Comments: Left medial ankle pain with any movement or palpation. Mild swelling. No bruising noted.   Skin:    General: Skin is warm and dry.  Neurological:     Mental Status: She is alert and oriented to person, place, and time.     Cranial Nerves: No cranial nerve deficit.     Deep Tendon Reflexes: Reflexes are normal and symmetric.  Psychiatric:        Behavior: Behavior normal.        Thought Content: Thought content normal.        Judgment: Judgment normal.    X-ray- Negative, Preliminary reading by Evelina Dun, FNP [facility]  Temp 98.6 F (37 C) (Temporal)   Ht 5\' 5"  (1.651 m)   LMP 06/16/2012   BMI 30.92 kg/m         Assessment & Plan:  Caitlin Kennedy comes in today with chief complaint of Foot Pain (fell down stairs this morning left foot pain)   Diagnosis and orders addressed:  1. Left foot pain - DG Foot Complete Left; Future - ibuprofen (ADVIL) 800 MG tablet; Take 1 tablet (800 mg total) by mouth 3 (three) times daily.  Dispense: 21 tablet; Refill: 2  2. Fall down stairs, initial encounter - ibuprofen (ADVIL) 800 MG tablet; Take 1 tablet (800 mg  total) by mouth 3 (three) times daily.  Dispense: 21 tablet; Refill: 2  3. Sprain of left ankle, unspecified ligament, initial encounter - ibuprofen (ADVIL) 800 MG tablet; Take 1 tablet (800 mg total) by mouth 3 (three) times daily.  Dispense: 21 tablet; Refill: 2   Pt crying in pain. She can not take any pain medication per her because she is allergic. Will refill Motrin 800 mg.  Rest Ice Elevated ACE bandage applied Work note given RTO if symptoms worsen or do not improve    Evelina Dun, FNP

## 2019-01-04 NOTE — Patient Instructions (Signed)
Ankle Sprain  An ankle sprain is a stretch or tear in a ligament in the ankle. Ligaments are tissues that connect bones to each other. The two most common types of ankle sprains are:  Inversion sprain. This happens when the foot turns inward and the ankle rolls outward. It affects the ligament on the outside of the foot (lateral ligament).  Eversion sprain. This happens when the foot turns outward and the ankle rolls inward. It affects the ligament on the inner side of the foot (medial ligament). What are the causes? This condition is often caused by accidentally rolling or twisting the ankle. What increases the risk? You are more likely to develop this condition if you play sports. What are the signs or symptoms? Symptoms of this condition include:  Pain in your ankle.  Swelling.  Bruising. This may develop right after you sprain your ankle or 1-2 days later.  Trouble standing or walking, especially when you turn or change directions. How is this diagnosed? This condition is diagnosed with:  A physical exam. During the exam, your health care provider will press on certain parts of your foot and ankle and try to move them in certain ways.  X-ray imaging. These may be taken to see how severe the sprain is and to check for broken bones. How is this treated? This condition may be treated with:  A brace or splint. This is used to keep the ankle from moving until it heals.  An elastic bandage. This is used to support the ankle.  Crutches.  Pain medicine.  Surgery. This may be needed if the sprain is severe.  Physical therapy. This may help to improve the range of motion in the ankle. Follow these instructions at home: If you have a brace or a splint:  Wear the brace or splint as told by your health care provider. Remove it only as told by your health care provider.  Loosen the brace or splint if your toes tingle, become numb, or turn cold and blue.  Keep the brace or  splint clean.  If the brace or splint is not waterproof: ? Do not let it get wet. ? Cover it with a watertight covering when you take a bath or a shower. If you have an elastic bandage (dressing):  Remove it to shower or bathe.  Try not to move your ankle much, but wiggle your toes from time to time. This helps to prevent swelling.  Adjust the dressing to make it more comfortable if it feels too tight.  Loosen the dressing if you have numbness or tingling in your foot, or if your foot becomes cold and blue. Managing pain, stiffness, and swelling   Take over-the-counter and prescription medicines only as told by your health care provider.  For 2-3 days, keep your ankle raised (elevated) above the level of your heart as much as possible.  If directed, put ice on the injured area: ? If you have a removable brace or splint, remove it as told by your health care provider. ? Put ice in a plastic bag. ? Place a towel between your skin and the bag. ? Leave the ice on for 20 minutes, 2-3 times a day. General instructions  Rest your ankle.  Do not use the injured limb to support your body weight until your health care provider says that you can. Use crutches as told by your health care provider.  Do not use any products that contain nicotine or tobacco, such as   cigarettes, e-cigarettes, and chewing tobacco. If you need help quitting, ask your health care provider.  Keep all follow-up visits as told by your health care provider. This is important. Contact a health care provider if:  You have rapidly increasing bruising or swelling.  Your pain is not relieved with medicine. Get help right away if:  Your foot or toes become numb or blue.  You have severe pain that gets worse. Summary  An ankle sprain is a stretch or tear in a ligament in the ankle. Ligaments are tissues that connect bones to each other.  This condition is often caused by accidentally rolling or twisting the ankle.   Symptoms include pain, swelling, bruising, and trouble walking.  To relieve pain and swelling, put ice on the affected ankle, raise your ankle above the level of your heart, and use an elastic bandage.  Keep all follow-up visits as told by your health care provider. This is important. This information is not intended to replace advice given to you by your health care provider. Make sure you discuss any questions you have with your health care provider. Document Released: 02/02/2005 Document Revised: 10/25/2017 Document Reviewed: 06/29/2017 Elsevier Patient Education  2020 [employer].

## 2019-01-19 DIAGNOSIS — E785 Hyperlipidemia, unspecified: Secondary | ICD-10-CM | POA: Diagnosis not present

## 2019-01-19 DIAGNOSIS — I10 Essential (primary) hypertension: Secondary | ICD-10-CM | POA: Diagnosis not present

## 2019-01-19 DIAGNOSIS — E663 Overweight: Secondary | ICD-10-CM | POA: Diagnosis not present

## 2019-01-19 DIAGNOSIS — Z79899 Other long term (current) drug therapy: Secondary | ICD-10-CM | POA: Diagnosis not present

## 2019-01-19 DIAGNOSIS — Z0000 Encounter for general adult medical examination without abnormal findings: Secondary | ICD-10-CM | POA: Diagnosis not present

## 2019-01-23 DIAGNOSIS — E876 Hypokalemia: Secondary | ICD-10-CM | POA: Diagnosis not present

## 2019-01-23 DIAGNOSIS — I10 Essential (primary) hypertension: Secondary | ICD-10-CM | POA: Diagnosis not present

## 2019-01-23 DIAGNOSIS — D0591 Unspecified type of carcinoma in situ of right breast: Secondary | ICD-10-CM | POA: Diagnosis not present

## 2019-01-23 DIAGNOSIS — D509 Iron deficiency anemia, unspecified: Secondary | ICD-10-CM | POA: Diagnosis not present

## 2019-02-06 DIAGNOSIS — Z8639 Personal history of other endocrine, nutritional and metabolic disease: Secondary | ICD-10-CM | POA: Diagnosis not present

## 2019-02-06 DIAGNOSIS — Z79810 Long term (current) use of selective estrogen receptor modulators (SERMs): Secondary | ICD-10-CM | POA: Diagnosis not present

## 2019-02-06 DIAGNOSIS — C50911 Malignant neoplasm of unspecified site of right female breast: Secondary | ICD-10-CM | POA: Diagnosis not present

## 2019-02-06 DIAGNOSIS — Z9013 Acquired absence of bilateral breasts and nipples: Secondary | ICD-10-CM | POA: Diagnosis not present

## 2019-08-07 DIAGNOSIS — C50411 Malignant neoplasm of upper-outer quadrant of right female breast: Secondary | ICD-10-CM | POA: Diagnosis not present

## 2019-08-07 DIAGNOSIS — Z170 Estrogen receptor positive status [ER+]: Secondary | ICD-10-CM | POA: Diagnosis not present

## 2019-08-07 DIAGNOSIS — Z79810 Long term (current) use of selective estrogen receptor modulators (SERMs): Secondary | ICD-10-CM | POA: Diagnosis not present

## 2019-08-28 ENCOUNTER — Other Ambulatory Visit: Payer: Self-pay

## 2019-08-28 ENCOUNTER — Encounter (HOSPITAL_COMMUNITY): Payer: Self-pay | Admitting: Emergency Medicine

## 2019-08-28 ENCOUNTER — Ambulatory Visit (HOSPITAL_COMMUNITY)
Admission: EM | Admit: 2019-08-28 | Discharge: 2019-08-28 | Disposition: A | Discharge status: Home / Self Care | Payer: Federal, State, Local not specified - PPO | Attending: Physician Assistant | Admitting: Physician Assistant

## 2019-08-28 DIAGNOSIS — G43909 Migraine, unspecified, not intractable, without status migrainosus: Secondary | ICD-10-CM | POA: Diagnosis not present

## 2019-08-28 DIAGNOSIS — J0120 Acute ethmoidal sinusitis, unspecified: Secondary | ICD-10-CM

## 2019-08-28 DIAGNOSIS — Z885 Allergy status to narcotic agent status: Secondary | ICD-10-CM | POA: Insufficient documentation

## 2019-08-28 DIAGNOSIS — Z7951 Long term (current) use of inhaled steroids: Secondary | ICD-10-CM | POA: Insufficient documentation

## 2019-08-28 DIAGNOSIS — Z7982 Long term (current) use of aspirin: Secondary | ICD-10-CM | POA: Diagnosis not present

## 2019-08-28 DIAGNOSIS — Z853 Personal history of malignant neoplasm of breast: Secondary | ICD-10-CM | POA: Insufficient documentation

## 2019-08-28 DIAGNOSIS — Z79899 Other long term (current) drug therapy: Secondary | ICD-10-CM | POA: Diagnosis not present

## 2019-08-28 DIAGNOSIS — I10 Essential (primary) hypertension: Secondary | ICD-10-CM | POA: Diagnosis not present

## 2019-08-28 DIAGNOSIS — Z87891 Personal history of nicotine dependence: Secondary | ICD-10-CM | POA: Insufficient documentation

## 2019-08-28 DIAGNOSIS — K219 Gastro-esophageal reflux disease without esophagitis: Secondary | ICD-10-CM | POA: Insufficient documentation

## 2019-08-28 DIAGNOSIS — Z791 Long term (current) use of non-steroidal anti-inflammatories (NSAID): Secondary | ICD-10-CM | POA: Insufficient documentation

## 2019-08-28 DIAGNOSIS — Z20822 Contact with and (suspected) exposure to covid-19: Secondary | ICD-10-CM | POA: Diagnosis not present

## 2019-08-28 DIAGNOSIS — F4310 Post-traumatic stress disorder, unspecified: Secondary | ICD-10-CM | POA: Diagnosis not present

## 2019-08-28 MED ORDER — AMOXICILLIN-POT CLAVULANATE 875-125 MG PO TABS: 1.0000 | ORAL_TABLET | Freq: Two times a day (BID) | ORAL | 0 refills | Status: AC

## 2019-08-28 NOTE — Discharge Instructions (Signed)
See your Physician for recheck.  °

## 2019-08-28 NOTE — ED Triage Notes (Signed)
Patient was camping last Wednesday.  Patient has been coughing since then.  Patient has a sore throat.  Complains of fatigue

## 2019-08-28 NOTE — ED Notes (Signed)
Initially declined covid test and wished to speak to provider about testing

## 2019-08-28 NOTE — ED Provider Notes (Signed)
[facility]    CSN: [id number] Arrival date & time: 08/28/19  1303      History   Chief Complaint Chief Complaint  Patient presents with   Cough    HPI Caitlin Kennedy is a 54 y.o. female.   The history is provided by the patient. No language interpreter was used.  Cough Cough characteristics:  Non-productive Sputum characteristics:  Nondescript Severity:  Moderate Onset quality:  Gradual Duration:  3 days Timing:  Constant Progression:  Worsening Chronicity:  New Smoker: no   Relieved by:  Nothing Worsened by:  Nothing Ineffective treatments:  None tried Associated symptoms: no shortness of breath   Pt complains of sinus congestion and pressure  Pt reports symptoms began after beind exposed to a campfire with bad wood   Past Medical History:  Diagnosis Date   Cancer ([place]) 10/2015   Breast    Migraine    chronic   PTSD (post-traumatic stress disorder)    Sinus infection    chronic   Subdural hematoma [facility])     Patient Active Problem List   Diagnosis Date Noted   Essential hypertension 06/03/2016   Other allergic rhinitis 10/16/2015   Lactose intolerance 10/16/2015   Gastroesophageal reflux disease without esophagitis 10/16/2015    Past Surgical History:  Procedure Laterality Date   ADENOIDECTOMY     BREAST SURGERY     TONSILLECTOMY     TYMPANOSTOMY TUBE PLACEMENT      OB History   No obstetric history on file.      Home Medications    Prior to Admission medications   Medication Sig Start Date End Date Taking? Authorizing Provider  acyclovir (ZOVIRAX) 800 MG tablet Take 800 mg by mouth 5 (five) times daily.   Yes [provider]  aspirin EC 81 MG tablet Take 81 mg by mouth daily. Swallow whole.   Yes [provider]  B Complex Vitamins (VITAMIN-B COMPLEX) TABS Take by mouth.   Yes [provider]  beclomethasone (BECONASE-AQ) 42 MCG/SPRAY nasal spray Place into the nose.   Yes [provider]  butalbital-acetaminophen-caffeine (FIORICET, ESGIC) 50-325-40 MG per tablet Take 1 tablet by mouth 2 (two) times daily as needed for headache or migraine.   Yes [provider]  carvedilol (COREG) 12.5 MG tablet Take by mouth.   Yes [provider]  cholecalciferol (VITAMIN D) 400 units TABS tablet Take 400 Units by mouth.   Yes [provider]  citalopram (CELEXA) 40 MG tablet Take 40 mg by mouth daily.   Yes [provider]  Cranberry Fruit 405 MG CAPS Take 8,400 mg by mouth.   Yes [provider]  fexofenadine (ALLEGRA) 180 MG tablet Take by mouth.   Yes [provider]  Magnesium 250 MG TABS Take 500 mg by mouth.   Yes [provider]  montelukast (SINGULAIR) 10 MG tablet Take 10 mg by mouth daily. 10/25/18  Yes [provider]  potassium chloride (KLOR-CON) 10 MEQ tablet Take 10 mEq by mouth 2 (two) times daily.   Yes [provider]  pravastatin (PRAVACHOL) 20 MG tablet Take 20 mg by mouth daily.   Yes [provider]  tamoxifen (NOLVADEX) 10 MG tablet Take 10 mg by mouth 2 (two) times daily.   Yes [provider]  amoxicillin-clavulanate (AUGMENTIN) 875-125 MG tablet Take 1 tablet by mouth 2 (two) times daily for 10 days. 08/28/19 09/07/19  Fransico Meadow, PA-C  ibuprofen (ADVIL) 800 MG tablet  Take 1 tablet (800 mg total) by mouth 3 (three) times daily. 01/04/19   Sharion Balloon, FNP  LORazepam (ATIVAN) 1 MG tablet Take 1 mg by mouth every 6 (six) hours as needed for anxiety.    [provider]  mupirocin ointment (BACTROBAN) 2 % as needed. 09/02/17   [provider]  nortriptyline (PAMELOR) 10 MG capsule Take by mouth.    [provider]    Family History Family History  Problem Relation Age of Onset   Cancer Mother    Cancer Other    Diabetes Other    CAD Other    Allergic rhinitis Sister    Angioedema Neg Hx    Asthma Neg Hx     Eczema Neg Hx    Immunodeficiency Neg Hx    Urticaria Neg Hx     Social History Social History   Tobacco Use   Smoking status: Former Smoker    Types: Cigarettes   Smokeless tobacco: Never Used  Scientific laboratory technician Use: Never used  Substance Use Topics   Alcohol use: Yes    Comment: rare   Drug use: No     Allergies   Codeine, Hydrocodone, Lac bovis, Milk-related compounds, Pork-derived products, Tetracyclines & related, Hydrocodone-acetaminophen, and Oxycodone   Review of Systems Review of Systems  Respiratory: Positive for cough. Negative for shortness of breath.   All other systems reviewed and are negative.    Physical Exam Triage Vital Signs ED Triage Vitals  Enc Vitals Group     BP 08/28/19 1448 118/75     Pulse Rate 08/28/19 1448 78     Resp 08/28/19 1448 20     Temp 08/28/19 1448 97.7 F (36.5 C)     Temp Source 08/28/19 1448 Oral     SpO2 08/28/19 1448 97 %     Weight --      Height --      Head Circumference --      Peak Flow --      Pain Score 08/28/19 1437 7     Pain Loc --      Pain Edu? --      Excl. in [place]? --    No data found.  Updated Vital Signs BP 118/75 (BP Location: Left Arm)    Pulse 78    Temp 97.7 F (36.5 C) (Oral)    Resp 20    LMP 06/16/2012    SpO2 97%   Visual Acuity Right Eye Distance:   Left Eye Distance:   Bilateral Distance:    Right Eye Near:   Left Eye Near:    Bilateral Near:     Physical Exam Vitals and nursing note reviewed.  Constitutional:      Appearance: She is well-developed.  HENT:     Head: Normocephalic.     Nose: Nose normal.     Mouth/Throat:     Mouth: Mucous membranes are moist.  Cardiovascular:     Rate and Rhythm: Normal rate.     Pulses: Normal pulses.  Pulmonary:     Effort: Pulmonary effort is normal.  Abdominal:     General: Abdomen is flat. There is no distension.  Musculoskeletal:        General: Normal range of motion.     Cervical back: Normal range of motion.   Skin:    General: Skin is warm.  Neurological:     General: No focal deficit present.     Mental  Status: She is alert and oriented to person, place, and time.  Psychiatric:        Mood and Affect: Mood normal.      UC Treatments / Results  Labs (all labs ordered are listed, but only abnormal results are displayed) Labs Reviewed  SARS CORONAVIRUS 2 (TAT 6-24 HRS)    EKG   Radiology No results found.  Procedures Procedures (including critical care time)  Medications Ordered in UC Medications - No data to display  Initial Impression / Assessment and Plan / UC Course  I have reviewed the triage vital signs and the nursing notes.  Pertinent labs & imaging results that were available during my care of the patient were reviewed by me and considered in my medical decision making (see chart for details).     MDM:  Covid ordered.  I will treat with augmentin as pt has recurrent sinus infections  Final Clinical Impressions(s) / UC Diagnoses   Final diagnoses:  Acute ethmoidal sinusitis, recurrence not specified     Discharge Instructions     See your Physician for recheck    ED Prescriptions    Medication Sig Dispense Auth. Provider   amoxicillin-clavulanate (AUGMENTIN) 875-125 MG tablet Take 1 tablet by mouth 2 (two) times daily for 10 days. 20 tablet Fransico Meadow, [place]     PDMP not reviewed this encounter.  An After Visit Summary was printed and given to the patient.    Fransico Meadow, [place] 08/28/19 2144

## 2019-08-29 LAB — SARS CORONAVIRUS 2 (TAT 6-24 HRS): SARS Coronavirus 2: NEGATIVE

## 2019-09-29 DIAGNOSIS — K027 Dental root caries: Secondary | ICD-10-CM | POA: Diagnosis not present

## 2019-11-10 DIAGNOSIS — I209 Angina pectoris, unspecified: Secondary | ICD-10-CM | POA: Diagnosis not present

## 2019-11-12 DIAGNOSIS — R9431 Abnormal electrocardiogram [ECG] [EKG]: Secondary | ICD-10-CM | POA: Diagnosis not present

## 2019-11-16 DIAGNOSIS — R0789 Other chest pain: Secondary | ICD-10-CM | POA: Diagnosis not present

## 2019-11-16 DIAGNOSIS — F4312 Post-traumatic stress disorder, chronic: Secondary | ICD-10-CM | POA: Diagnosis not present

## 2019-11-22 DIAGNOSIS — Z7189 Other specified counseling: Secondary | ICD-10-CM | POA: Diagnosis not present

## 2019-11-27 DIAGNOSIS — I200 Unstable angina: Secondary | ICD-10-CM | POA: Diagnosis not present

## 2019-12-12 DIAGNOSIS — H5203 Hypermetropia, bilateral: Secondary | ICD-10-CM | POA: Diagnosis not present

## 2019-12-12 DIAGNOSIS — H524 Presbyopia: Secondary | ICD-10-CM | POA: Diagnosis not present

## 2019-12-12 DIAGNOSIS — H52223 Regular astigmatism, bilateral: Secondary | ICD-10-CM | POA: Diagnosis not present

## 2020-01-04 DIAGNOSIS — J310 Chronic rhinitis: Secondary | ICD-10-CM | POA: Diagnosis not present

## 2020-01-17 DIAGNOSIS — Z79810 Long term (current) use of selective estrogen receptor modulators (SERMs): Secondary | ICD-10-CM | POA: Diagnosis not present

## 2020-01-17 DIAGNOSIS — C50411 Malignant neoplasm of upper-outer quadrant of right female breast: Secondary | ICD-10-CM | POA: Diagnosis not present

## 2020-01-17 DIAGNOSIS — Z170 Estrogen receptor positive status [ER+]: Secondary | ICD-10-CM | POA: Diagnosis not present

## 2020-01-29 DIAGNOSIS — C50911 Malignant neoplasm of unspecified site of right female breast: Secondary | ICD-10-CM | POA: Diagnosis not present

## 2020-01-29 DIAGNOSIS — Z79810 Long term (current) use of selective estrogen receptor modulators (SERMs): Secondary | ICD-10-CM | POA: Diagnosis not present

## 2020-01-29 DIAGNOSIS — Z9013 Acquired absence of bilateral breasts and nipples: Secondary | ICD-10-CM | POA: Diagnosis not present

## 2020-03-11 DIAGNOSIS — I208 Other forms of angina pectoris: Secondary | ICD-10-CM | POA: Diagnosis not present

## 2020-03-11 DIAGNOSIS — Z0000 Encounter for general adult medical examination without abnormal findings: Secondary | ICD-10-CM | POA: Diagnosis not present

## 2020-03-11 DIAGNOSIS — G43909 Migraine, unspecified, not intractable, without status migrainosus: Secondary | ICD-10-CM | POA: Diagnosis not present

## 2020-03-11 DIAGNOSIS — R0683 Snoring: Secondary | ICD-10-CM | POA: Diagnosis not present

## 2020-03-11 DIAGNOSIS — Z01419 Encounter for gynecological examination (general) (routine) without abnormal findings: Secondary | ICD-10-CM | POA: Diagnosis not present

## 2020-03-11 DIAGNOSIS — G43919 Migraine, unspecified, intractable, without status migrainosus: Secondary | ICD-10-CM | POA: Diagnosis not present

## 2020-03-11 DIAGNOSIS — I209 Angina pectoris, unspecified: Secondary | ICD-10-CM | POA: Diagnosis not present

## 2020-03-12 DIAGNOSIS — Z79810 Long term (current) use of selective estrogen receptor modulators (SERMs): Secondary | ICD-10-CM | POA: Diagnosis not present

## 2020-05-25 IMAGING — DX DG FOOT COMPLETE 3+V*L*
3 series · 3 of 3 positions shown · non-contrast
Comparison: October 27, 2011

CLINICAL DATA: Pain following rolling injury

EXAM:
LEFT FOOT - COMPLETE 3+ VIEW

[foot ap]
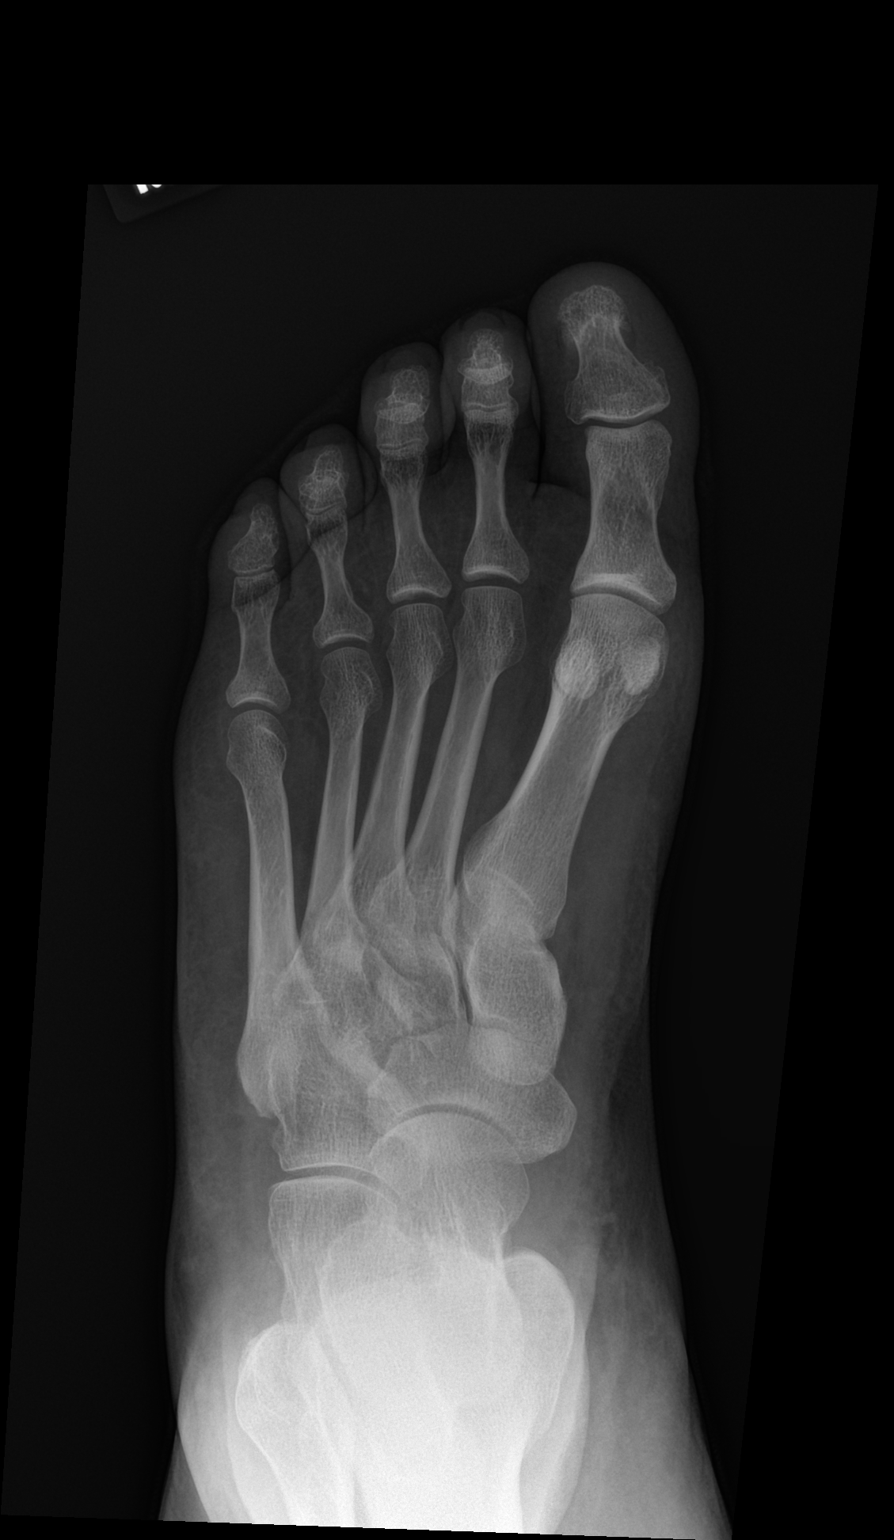

[foot obl]
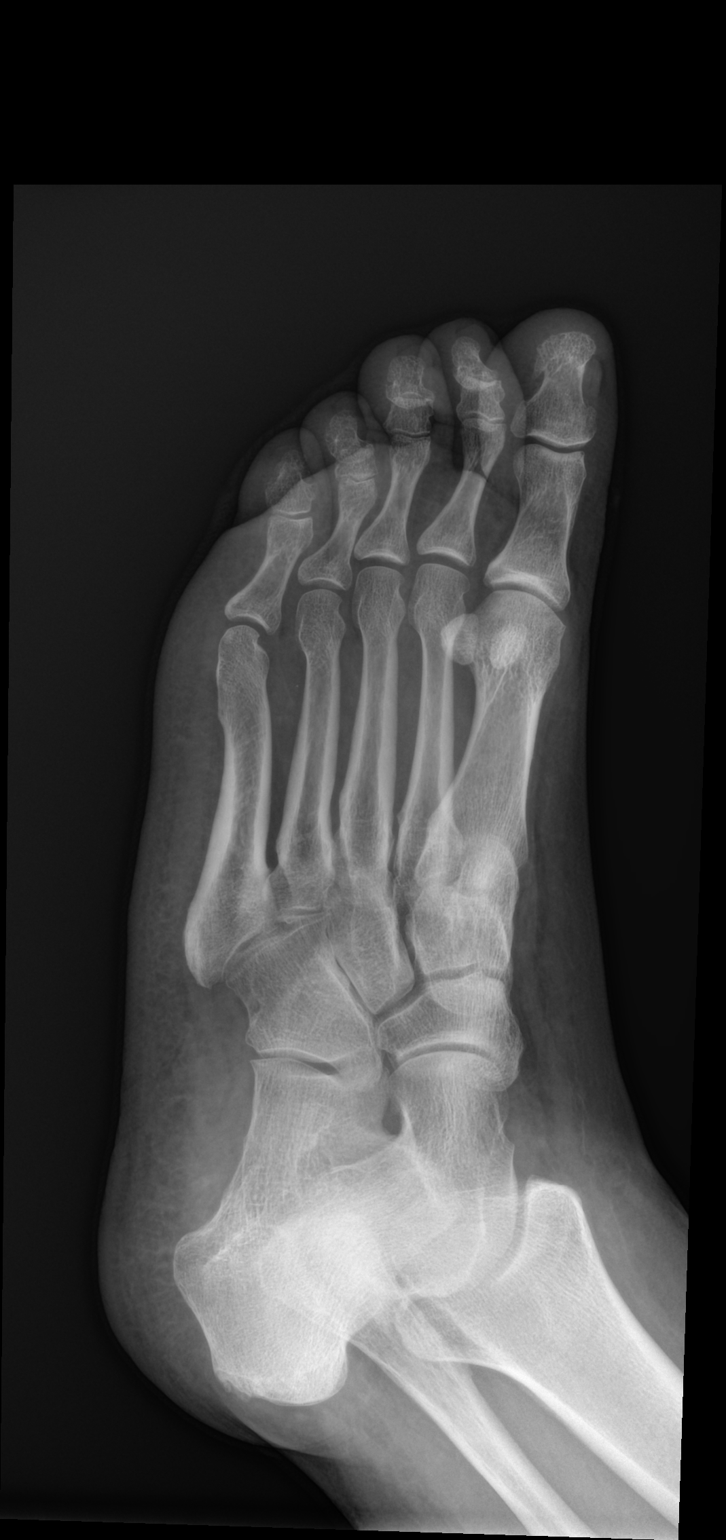

[foot lat]
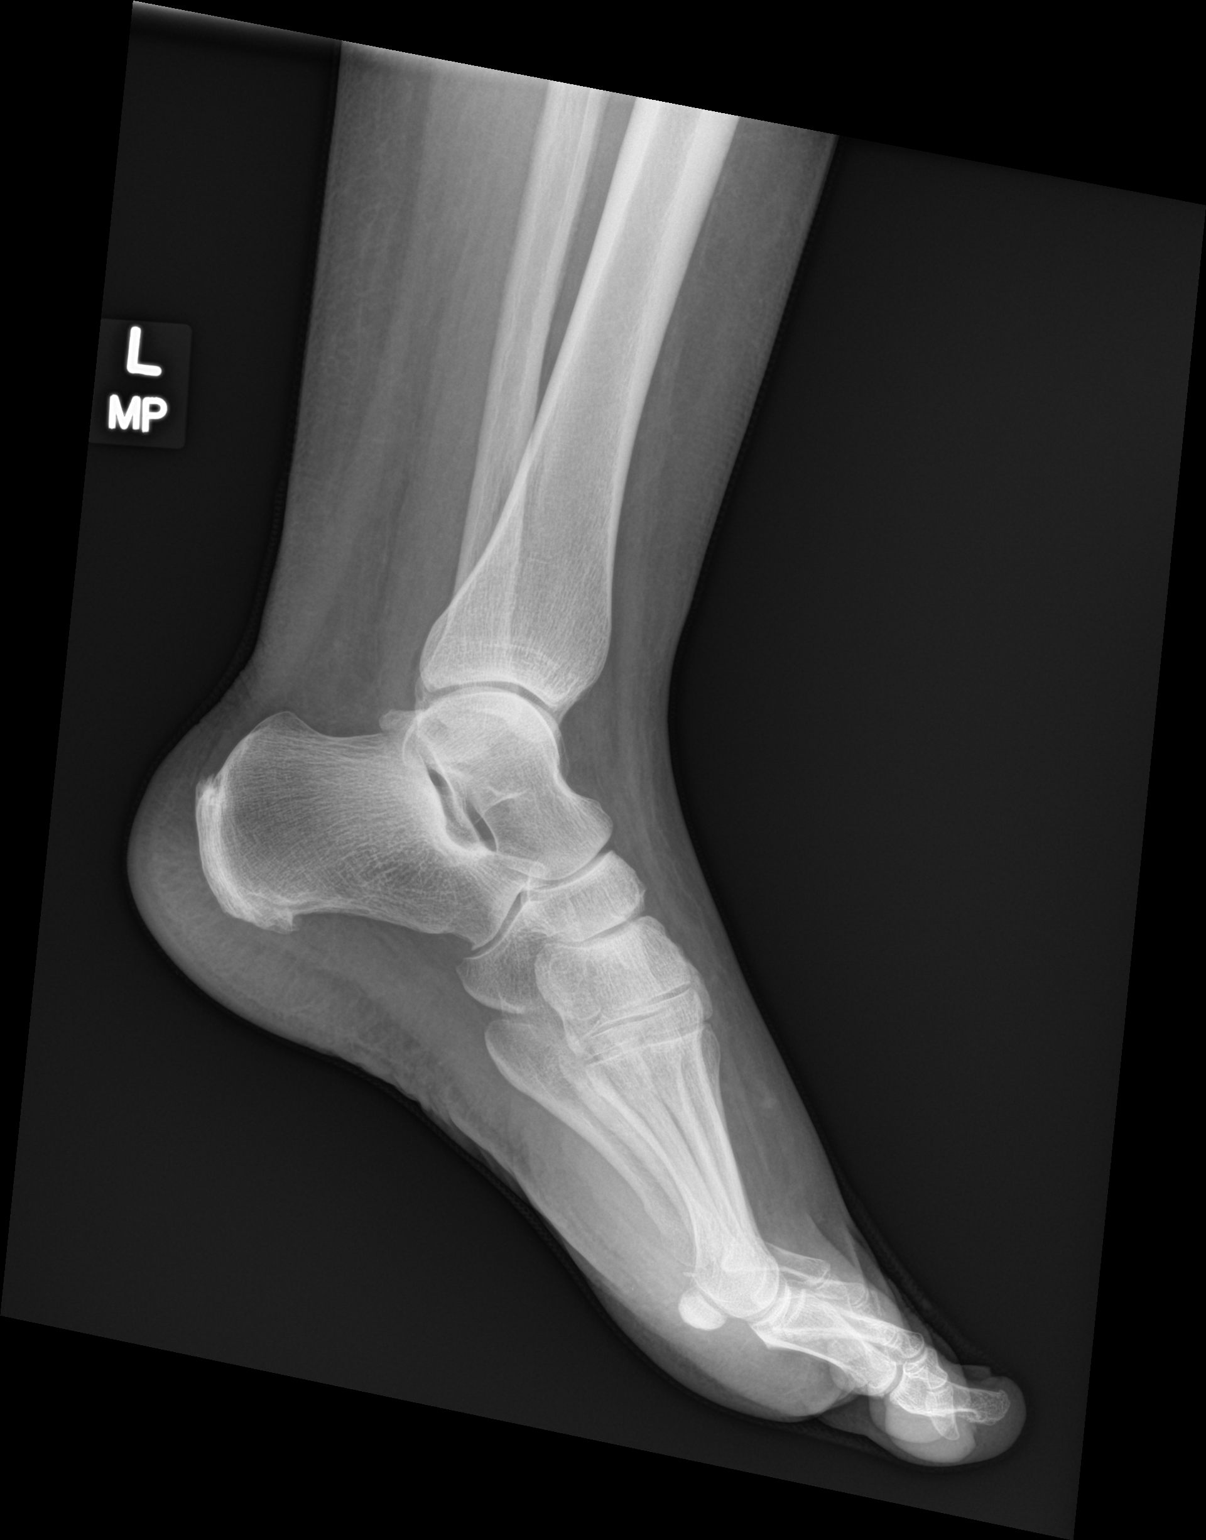

[3 of 3 positions shown; findings below may reference images not displayed]

FINDINGS: Frontal, oblique, and lateral views were obtained. No fracture or
dislocation. Joint spaces appear normal. No erosive changes. Small
posterior and inferior calcaneal spurs noted.
IMPRESSION: No fracture or dislocation. No appreciable arthropathy. There are
small calcaneal spurs.

## 2020-06-14 DIAGNOSIS — Z23 Encounter for immunization: Secondary | ICD-10-CM | POA: Diagnosis not present

## 2020-07-29 DIAGNOSIS — M546 Pain in thoracic spine: Secondary | ICD-10-CM | POA: Diagnosis not present

## 2020-07-29 DIAGNOSIS — Z9013 Acquired absence of bilateral breasts and nipples: Secondary | ICD-10-CM | POA: Diagnosis not present

## 2020-07-29 DIAGNOSIS — R202 Paresthesia of skin: Secondary | ICD-10-CM | POA: Diagnosis not present

## 2020-07-29 DIAGNOSIS — C50411 Malignant neoplasm of upper-outer quadrant of right female breast: Secondary | ICD-10-CM | POA: Diagnosis not present

## 2020-07-29 DIAGNOSIS — Z881 Allergy status to other antibiotic agents status: Secondary | ICD-10-CM | POA: Diagnosis not present

## 2020-07-29 DIAGNOSIS — Z79810 Long term (current) use of selective estrogen receptor modulators (SERMs): Secondary | ICD-10-CM | POA: Diagnosis not present

## 2020-07-29 DIAGNOSIS — Z888 Allergy status to other drugs, medicaments and biological substances status: Secondary | ICD-10-CM | POA: Diagnosis not present

## 2020-07-29 DIAGNOSIS — Z7982 Long term (current) use of aspirin: Secondary | ICD-10-CM | POA: Diagnosis not present

## 2020-07-29 DIAGNOSIS — Z08 Encounter for follow-up examination after completed treatment for malignant neoplasm: Secondary | ICD-10-CM | POA: Diagnosis not present

## 2020-07-29 DIAGNOSIS — M545 Low back pain, unspecified: Secondary | ICD-10-CM | POA: Diagnosis not present

## 2020-07-29 DIAGNOSIS — Z885 Allergy status to narcotic agent status: Secondary | ICD-10-CM | POA: Diagnosis not present

## 2020-07-29 DIAGNOSIS — Z170 Estrogen receptor positive status [ER+]: Secondary | ICD-10-CM | POA: Diagnosis not present

## 2020-07-29 DIAGNOSIS — Z79899 Other long term (current) drug therapy: Secondary | ICD-10-CM | POA: Diagnosis not present

## 2020-07-29 DIAGNOSIS — R200 Anesthesia of skin: Secondary | ICD-10-CM | POA: Diagnosis not present

## 2020-09-26 ENCOUNTER — Encounter: Payer: Self-pay | Admitting: Family Medicine

## 2020-09-26 ENCOUNTER — Ambulatory Visit (INDEPENDENT_AMBULATORY_CARE_PROVIDER_SITE_OTHER): Payer: Federal, State, Local not specified - PPO | Admitting: Family Medicine

## 2020-09-26 DIAGNOSIS — Z20822 Contact with and (suspected) exposure to covid-19: Secondary | ICD-10-CM | POA: Diagnosis not present

## 2020-09-26 DIAGNOSIS — U071 COVID-19: Secondary | ICD-10-CM

## 2020-09-26 MED ORDER — PREDNISONE 20 MG PO TABS: 40.0000 mg | ORAL_TABLET | Freq: Every day | ORAL | 0 refills | Status: AC

## 2020-09-26 MED ORDER — GUAIFENESIN ER 600 MG PO TB12: 1200.0000 mg | ORAL_TABLET | Freq: Two times a day (BID) | ORAL | 0 refills | Status: DC | PRN

## 2020-09-26 MED ORDER — MOLNUPIRAVIR EUA 200MG CAPSULE: 4.0000 | ORAL_CAPSULE | Freq: Two times a day (BID) | ORAL | 0 refills | Status: AC

## 2020-09-26 MED ORDER — BENZONATATE 100 MG PO CAPS: 100.0000 mg | ORAL_CAPSULE | Freq: Three times a day (TID) | ORAL | 0 refills | Status: DC | PRN

## 2020-09-26 NOTE — Progress Notes (Signed)
Virtual Visit  Note Due to COVID-19 pandemic this visit was conducted virtually. This visit type was conducted due to national recommendations for restrictions regarding the COVID-19 Pandemic (e.g. social distancing, sheltering in place) in an effort to limit this patient's exposure and mitigate transmission in our community. All issues noted in this document were discussed and addressed.  A physical exam was not performed with this format.  I connected with Caitlin Kennedy on 09/26/20 at 1123 by telephone and verified that I am speaking with the correct person using two identifiers. Caitlin Kennedy is currently located at home and her husband is currently with her during the visit. The provider, Gwenlyn Perking, FNP is located in their office at time of visit.  I discussed the limitations, risks, security and privacy concerns of performing an evaluation and management service by telephone and the availability of in person appointments. I also discussed with the patient that there may be a patient responsible charge related to this service. The patient expressed understanding and agreed to proceed.  CC: Covid positive   History and Present Illness:  HPI History was provided by both Caitlin Kennedy and her husband. Caitlin Kennedy tested positive for Covid this morning. She reports symptoms that started 2 days ago. She reports congestion, dry cough, fever, body aches, and chills. She denies dyspnea, vomiting, diarrhea, or chest pain. She has not taken any medications for her symptoms. She has been vaccinated and had a booster.     ROS As per HPI.   Observations/Objective: Alert and oriented x 3. Able to speak in full sentences without difficulty.   Assessment and Plan: [name] was seen today for covid positive.  Diagnoses and all orders for this visit:  COVID-19 Molnupiravir ordered given hx of HTN and cancer. Plain mucinex for cough, congestion. Tessalon perles for cough. Prednisone burst as below.  Discussed quarantine, return precautions, and when to seek emergency care.  -     molnupiravir EUA 200 mg CAPS; Take 4 capsules (800 mg total) by mouth 2 (two) times daily for 5 days. -     guaiFENesin (MUCINEX) 600 MG 12 hr tablet; Take 2 tablets (1,200 mg total) by mouth 2 (two) times daily as needed for cough or to loosen phlegm. -     predniSONE (DELTASONE) 20 MG tablet; Take 2 tablets (40 mg total) by mouth daily with breakfast for 3 days. -     benzonatate (TESSALON) 100 MG capsule; Take 1 capsule (100 mg total) by mouth 3 (three) times daily as needed for cough.    Follow Up Instructions: Return to office for new or worsening symptoms, or if symptoms persist.     I discussed the assessment and treatment plan with the patient. The patient was provided an opportunity to ask questions and all were answered. The patient agreed with the plan and demonstrated an understanding of the instructions.   The patient was advised to call back or seek an in-person evaluation if the symptoms worsen or if the condition fails to improve as anticipated.  The above assessment and management plan was discussed with the patient. The patient verbalized understanding of and has agreed to the management plan. Patient is aware to call the clinic if symptoms persist or worsen. Patient is aware when to return to the clinic for a follow-up visit. Patient educated on when it is appropriate to go to the emergency department.   Time call ended:  1134  I provided 11 minutes of  non face-to-face time during  this encounter.    Gwenlyn Perking, FNP

## 2020-10-25 DIAGNOSIS — E785 Hyperlipidemia, unspecified: Secondary | ICD-10-CM | POA: Diagnosis not present

## 2020-10-25 DIAGNOSIS — R0789 Other chest pain: Secondary | ICD-10-CM | POA: Diagnosis not present

## 2020-10-25 DIAGNOSIS — I10 Essential (primary) hypertension: Secondary | ICD-10-CM | POA: Diagnosis not present

## 2020-10-25 DIAGNOSIS — I209 Angina pectoris, unspecified: Secondary | ICD-10-CM | POA: Diagnosis not present

## 2020-10-31 DIAGNOSIS — K08109 Complete loss of teeth, unspecified cause, unspecified class: Secondary | ICD-10-CM | POA: Diagnosis not present

## 2021-01-25 DIAGNOSIS — R0981 Nasal congestion: Secondary | ICD-10-CM | POA: Diagnosis not present

## 2021-01-25 DIAGNOSIS — R519 Headache, unspecified: Secondary | ICD-10-CM | POA: Diagnosis not present

## 2021-01-25 DIAGNOSIS — J029 Acute pharyngitis, unspecified: Secondary | ICD-10-CM | POA: Diagnosis not present

## 2021-01-25 DIAGNOSIS — J3489 Other specified disorders of nose and nasal sinuses: Secondary | ICD-10-CM | POA: Diagnosis not present

## 2021-02-27 ENCOUNTER — Encounter: Payer: Self-pay | Admitting: Family Medicine

## 2021-02-27 ENCOUNTER — Ambulatory Visit (INDEPENDENT_AMBULATORY_CARE_PROVIDER_SITE_OTHER): Payer: Federal, State, Local not specified - PPO | Admitting: Family Medicine

## 2021-02-27 DIAGNOSIS — J40 Bronchitis, not specified as acute or chronic: Secondary | ICD-10-CM | POA: Diagnosis not present

## 2021-02-27 MED ORDER — BENZONATATE 100 MG PO CAPS
100.0000 mg | ORAL_CAPSULE | Freq: Three times a day (TID) | ORAL | 0 refills | Status: AC | PRN
Start: 2021-02-27 — End: ?

## 2021-02-27 MED ORDER — GUAIFENESIN ER 600 MG PO TB12: 600.0000 mg | ORAL_TABLET | Freq: Two times a day (BID) | ORAL | 0 refills | Status: AC

## 2021-02-27 MED ORDER — ALBUTEROL SULFATE HFA 108 (90 BASE) MCG/ACT IN AERS: 2.0000 | INHALATION_SPRAY | Freq: Four times a day (QID) | RESPIRATORY_TRACT | 2 refills | Status: AC | PRN

## 2021-02-27 MED ORDER — PREDNISONE 20 MG PO TABS
40.0000 mg | ORAL_TABLET | Freq: Every day | ORAL | 0 refills | Status: AC
Start: 2021-02-27 — End: 2021-03-04

## 2021-02-27 NOTE — Progress Notes (Signed)
Virtual Visit via telephone Note Due to COVID-19 pandemic this visit was conducted virtually. This visit type was conducted due to national recommendations for restrictions regarding the COVID-19 Pandemic (e.g. social distancing, sheltering in place) in an effort to limit this patient's exposure and mitigate transmission in our community. All issues noted in this document were discussed and addressed.  A physical exam was not performed with this format.   I connected with Caitlin Kennedy on 02/27/2021 at 1341 by telephone and verified that I am speaking with the correct person using two identifiers. Caitlin Kennedy is currently located at home and family is currently with them during visit. The provider, Monia Pouch, FNP is located in their office at time of visit.  I discussed the limitations, risks, security and privacy concerns of performing an evaluation and management service by virtual visit and the availability of in person appointments. I also discussed with the patient that there may be a patient responsible charge related to this service. The patient expressed understanding and agreed to proceed.  Subjective:  Patient ID: Caitlin Kennedy, female    DOB: 04/15/1965, 56 y.o.   MRN: [id number]  Chief Complaint:  Cough   HPI: Caitlin Kennedy is a 56 y.o. female presenting on 02/27/2021 for Cough   Pt reports dry cough for the last 3 days. Denies fever, chills, congestion, fatigue, shortness of breath, weakness, or confusion. Does have slight wheezing at times. Has not tried anything for symptoms. Negative COVID test at home.   Cough This is a new problem. The current episode started in the past 7 days. The problem has been waxing and waning. The problem occurs every few minutes. The cough is Non-productive. Associated symptoms include wheezing. Pertinent negatives include no chest pain, chills, ear congestion, ear pain, fever, headaches, heartburn, hemoptysis, myalgias, nasal congestion,  postnasal drip, rash, rhinorrhea, sore throat, shortness of breath, sweats or weight loss. She has tried nothing for the symptoms. Her past medical history is significant for bronchitis.    Relevant past medical, surgical, family, and social history reviewed and updated as indicated.  Allergies and medications reviewed and updated.   Past Medical History:  Diagnosis Date   Cancer ([place]) 10/2015   Breast    Migraine    chronic   PTSD (post-traumatic stress disorder)    Sinus infection    chronic   Subdural hematoma     Past Surgical History:  Procedure Laterality Date   ADENOIDECTOMY     BREAST SURGERY     TONSILLECTOMY     TYMPANOSTOMY TUBE PLACEMENT      Social History   Socioeconomic History   Marital status: Married    Spouse name: Not on file   Number of children: Not on file   Years of education: Not on file   Highest education level: Not on file  Occupational History   Not on file  Tobacco Use   Smoking status: Former    Types: Cigarettes   Smokeless tobacco: Never  Vaping Use   Vaping Use: Never used  Substance and Sexual Activity   Alcohol use: Yes    Comment: rare   Drug use: No   Sexual activity: Yes  Other Topics Concern   Not on file  Social History Narrative   Not on file   Social Determinants of Health   Financial Resource Strain: Not on file  Food Insecurity: Not on file  Transportation Needs: Not on file  Physical Activity: Not on file  Stress: Not  on file  Social Connections: Not on file  Intimate Partner Violence: Not on file    Outpatient Encounter Medications as of 02/27/2021  Medication Sig   albuterol (VENTOLIN HFA) 108 (90 Base) MCG/ACT inhaler Inhale 2 puffs into the lungs every 6 (six) hours as needed for wheezing or shortness of breath.   benzonatate (TESSALON PERLES) 100 MG capsule Take 1 capsule (100 mg total) by mouth 3 (three) times daily as needed for cough.   guaiFENesin (MUCINEX) 600 MG 12 hr tablet Take 1 tablet (600  mg total) by mouth 2 (two) times daily for 10 days.   predniSONE (DELTASONE) 20 MG tablet Take 2 tablets (40 mg total) by mouth daily with breakfast for 5 days.   acyclovir (ZOVIRAX) 800 MG tablet Take 800 mg by mouth 5 (five) times daily.   aspirin EC 81 MG tablet Take 81 mg by mouth daily. Swallow whole.   B Complex Vitamins (VITAMIN-B COMPLEX) TABS Take by mouth.   beclomethasone (BECONASE-AQ) 42 MCG/SPRAY nasal spray Place into the nose.   butalbital-acetaminophen-caffeine (FIORICET, ESGIC) 50-325-40 MG per tablet Take 1 tablet by mouth 2 (two) times daily as needed for headache or migraine.   carvedilol (COREG) 12.5 MG tablet Take by mouth.   cholecalciferol (VITAMIN D) 400 units TABS tablet Take 400 Units by mouth.   citalopram (CELEXA) 40 MG tablet Take 40 mg by mouth daily.   Cranberry Fruit 405 MG CAPS Take 8,400 mg by mouth.   fexofenadine (ALLEGRA) 180 MG tablet Take by mouth.   ibuprofen (ADVIL) 800 MG tablet Take 1 tablet (800 mg total) by mouth 3 (three) times daily.   LORazepam (ATIVAN) 1 MG tablet Take 1 mg by mouth every 6 (six) hours as needed for anxiety.   Magnesium 250 MG TABS Take 500 mg by mouth.   montelukast (SINGULAIR) 10 MG tablet Take 10 mg by mouth daily.   mupirocin ointment (BACTROBAN) 2 % as needed.   nortriptyline (PAMELOR) 10 MG capsule Take by mouth.   potassium chloride (KLOR-CON) 10 MEQ tablet Take 10 mEq by mouth 2 (two) times daily.   pravastatin (PRAVACHOL) 20 MG tablet Take 20 mg by mouth daily.   tamoxifen (NOLVADEX) 10 MG tablet Take 10 mg by mouth 2 (two) times daily.   [DISCONTINUED] benzonatate (TESSALON) 100 MG capsule Take 1 capsule (100 mg total) by mouth 3 (three) times daily as needed for cough.   [DISCONTINUED] guaiFENesin (MUCINEX) 600 MG 12 hr tablet Take 2 tablets (1,200 mg total) by mouth 2 (two) times daily as needed for cough or to loosen phlegm.   No facility-administered encounter medications on file as of 02/27/2021.    Allergies   Allergen Reactions   Codeine    Hydrocodone Nausea And Vomiting    rash   Lac Bovis Nausea And Vomiting   Milk-Related Compounds Nausea And Vomiting   Pork-Derived Products Nausea And Vomiting   Tetracyclines & Related    Hydrocodone-Acetaminophen Rash   Oxycodone Rash    Review of Systems  Constitutional:  Positive for activity change. Negative for appetite change, chills, diaphoresis, fatigue, fever, unexpected weight change and weight loss.  HENT:  Negative for congestion, ear pain, postnasal drip, rhinorrhea and sore throat.   Respiratory:  Positive for cough and wheezing. Negative for apnea, hemoptysis, choking, chest tightness, shortness of breath and stridor.   Cardiovascular:  Negative for chest pain, palpitations and leg swelling.  Gastrointestinal:  Negative for heartburn.  Genitourinary:  Negative for decreased urine volume.  Musculoskeletal:  Negative for myalgias.  Skin:  Negative for rash.  Neurological:  Negative for dizziness, tremors, seizures, syncope, facial asymmetry, speech difficulty, weakness, light-headedness, numbness and headaches.  Psychiatric/Behavioral:  Negative for confusion.   All other systems reviewed and are negative.       Observations/Objective: No vital signs or physical exam, this was a virtual health encounter.  Pt alert and oriented, answers all questions appropriately, and able to speak in full sentences.    Assessment and Plan: Debrina was seen today for cough.  Diagnoses and all orders for this visit:  Bronchitis Symptoms consistent with bronchitis, no indications of acute bacterial infection. Will treat with below. Pt aware to report any new, worsening, or persistent symptoms.  -     benzonatate (TESSALON PERLES) 100 MG capsule; Take 1 capsule (100 mg total) by mouth 3 (three) times daily as needed for cough. -     albuterol (VENTOLIN HFA) 108 (90 Base) MCG/ACT inhaler; Inhale 2 puffs into the lungs every 6 (six) hours as needed  for wheezing or shortness of breath. -     predniSONE (DELTASONE) 20 MG tablet; Take 2 tablets (40 mg total) by mouth daily with breakfast for 5 days. -     guaiFENesin (MUCINEX) 600 MG 12 hr tablet; Take 1 tablet (600 mg total) by mouth 2 (two) times daily for 10 days.     Follow Up Instructions: Return in about 2 weeks (around 03/13/2021), or if symptoms worsen or fail to improve.    I discussed the assessment and treatment plan with the patient. The patient was provided an opportunity to ask questions and all were answered. The patient agreed with the plan and demonstrated an understanding of the instructions.   The patient was advised to call back or seek an in-person evaluation if the symptoms worsen or if the condition fails to improve as anticipated.  The above assessment and management plan was discussed with the patient. The patient verbalized understanding of and has agreed to the management plan. Patient is aware to call the clinic if they develop any new symptoms or if symptoms persist or worsen. Patient is aware when to return to the clinic for a follow-up visit. Patient educated on when it is appropriate to go to the emergency department.    I provided 15 minutes of time during this telephone encounter.   Monia Pouch, FNP-C [facility] [address] [place], [place] [address] [phone] 02/27/2021

## 2021-03-10 DIAGNOSIS — Z08 Encounter for follow-up examination after completed treatment for malignant neoplasm: Secondary | ICD-10-CM | POA: Diagnosis not present

## 2021-03-10 DIAGNOSIS — Z79810 Long term (current) use of selective estrogen receptor modulators (SERMs): Secondary | ICD-10-CM | POA: Diagnosis not present

## 2021-03-10 DIAGNOSIS — Z9013 Acquired absence of bilateral breasts and nipples: Secondary | ICD-10-CM | POA: Diagnosis not present

## 2021-03-10 DIAGNOSIS — C50411 Malignant neoplasm of upper-outer quadrant of right female breast: Secondary | ICD-10-CM | POA: Diagnosis not present

## 2021-03-12 DIAGNOSIS — Z23 Encounter for immunization: Secondary | ICD-10-CM | POA: Diagnosis not present

## 2021-03-12 DIAGNOSIS — F419 Anxiety disorder, unspecified: Secondary | ICD-10-CM | POA: Diagnosis not present

## 2021-03-12 DIAGNOSIS — E559 Vitamin D deficiency, unspecified: Secondary | ICD-10-CM | POA: Diagnosis not present

## 2021-03-12 DIAGNOSIS — Z0000 Encounter for general adult medical examination without abnormal findings: Secondary | ICD-10-CM | POA: Diagnosis not present

## 2021-03-12 DIAGNOSIS — D0511 Intraductal carcinoma in situ of right breast: Secondary | ICD-10-CM | POA: Diagnosis not present

## 2021-03-13 DIAGNOSIS — D0511 Intraductal carcinoma in situ of right breast: Secondary | ICD-10-CM | POA: Diagnosis not present

## 2021-03-13 DIAGNOSIS — F419 Anxiety disorder, unspecified: Secondary | ICD-10-CM | POA: Diagnosis not present

## 2021-03-13 DIAGNOSIS — M19011 Primary osteoarthritis, right shoulder: Secondary | ICD-10-CM | POA: Diagnosis not present

## 2021-03-13 DIAGNOSIS — F4312 Post-traumatic stress disorder, chronic: Secondary | ICD-10-CM | POA: Diagnosis not present

## 2021-03-13 DIAGNOSIS — R202 Paresthesia of skin: Secondary | ICD-10-CM | POA: Diagnosis not present

## 2021-03-13 DIAGNOSIS — E559 Vitamin D deficiency, unspecified: Secondary | ICD-10-CM | POA: Diagnosis not present

## 2021-03-13 DIAGNOSIS — Z23 Encounter for immunization: Secondary | ICD-10-CM | POA: Diagnosis not present

## 2021-03-13 DIAGNOSIS — R059 Cough, unspecified: Secondary | ICD-10-CM | POA: Diagnosis not present

## 2021-03-14 DIAGNOSIS — R202 Paresthesia of skin: Secondary | ICD-10-CM | POA: Diagnosis not present

## 2021-03-28 DIAGNOSIS — R94131 Abnormal electromyogram [EMG]: Secondary | ICD-10-CM | POA: Diagnosis not present

## 2021-03-28 DIAGNOSIS — G5613 Other lesions of median nerve, bilateral upper limbs: Secondary | ICD-10-CM | POA: Diagnosis not present

## 2021-05-01 DIAGNOSIS — M25511 Pain in right shoulder: Secondary | ICD-10-CM | POA: Diagnosis not present

## 2021-05-02 DIAGNOSIS — M25511 Pain in right shoulder: Secondary | ICD-10-CM | POA: Diagnosis not present

## 2021-05-08 DIAGNOSIS — M25511 Pain in right shoulder: Secondary | ICD-10-CM | POA: Diagnosis not present

## 2021-05-08 DIAGNOSIS — M542 Cervicalgia: Secondary | ICD-10-CM | POA: Diagnosis not present

## 2021-05-15 DIAGNOSIS — Z853 Personal history of malignant neoplasm of breast: Secondary | ICD-10-CM | POA: Diagnosis not present

## 2021-05-15 DIAGNOSIS — R0789 Other chest pain: Secondary | ICD-10-CM | POA: Diagnosis not present

## 2021-05-15 DIAGNOSIS — M542 Cervicalgia: Secondary | ICD-10-CM | POA: Diagnosis not present

## 2021-05-15 DIAGNOSIS — M25511 Pain in right shoulder: Secondary | ICD-10-CM | POA: Diagnosis not present

## 2021-05-22 DIAGNOSIS — M542 Cervicalgia: Secondary | ICD-10-CM | POA: Diagnosis not present

## 2021-05-22 DIAGNOSIS — M25511 Pain in right shoulder: Secondary | ICD-10-CM | POA: Diagnosis not present

## 2021-05-29 DIAGNOSIS — M542 Cervicalgia: Secondary | ICD-10-CM | POA: Diagnosis not present

## 2021-05-29 DIAGNOSIS — M25511 Pain in right shoulder: Secondary | ICD-10-CM | POA: Diagnosis not present

## 2021-06-05 DIAGNOSIS — M25511 Pain in right shoulder: Secondary | ICD-10-CM | POA: Diagnosis not present

## 2021-06-05 DIAGNOSIS — M542 Cervicalgia: Secondary | ICD-10-CM | POA: Diagnosis not present

## 2021-06-19 DIAGNOSIS — M542 Cervicalgia: Secondary | ICD-10-CM | POA: Diagnosis not present

## 2021-06-19 DIAGNOSIS — M25511 Pain in right shoulder: Secondary | ICD-10-CM | POA: Diagnosis not present

## 2021-06-30 DIAGNOSIS — M25511 Pain in right shoulder: Secondary | ICD-10-CM | POA: Diagnosis not present

## 2021-07-03 DIAGNOSIS — M25511 Pain in right shoulder: Secondary | ICD-10-CM | POA: Diagnosis not present

## 2021-07-03 DIAGNOSIS — M542 Cervicalgia: Secondary | ICD-10-CM | POA: Diagnosis not present

## 2021-09-08 DIAGNOSIS — G8929 Other chronic pain: Secondary | ICD-10-CM | POA: Diagnosis not present

## 2021-09-08 DIAGNOSIS — Z08 Encounter for follow-up examination after completed treatment for malignant neoplasm: Secondary | ICD-10-CM | POA: Diagnosis not present

## 2021-09-08 DIAGNOSIS — Z170 Estrogen receptor positive status [ER+]: Secondary | ICD-10-CM | POA: Diagnosis not present

## 2021-09-08 DIAGNOSIS — Z853 Personal history of malignant neoplasm of breast: Secondary | ICD-10-CM | POA: Diagnosis not present

## 2021-09-08 DIAGNOSIS — M549 Dorsalgia, unspecified: Secondary | ICD-10-CM | POA: Diagnosis not present

## 2021-09-08 DIAGNOSIS — Z780 Asymptomatic menopausal state: Secondary | ICD-10-CM | POA: Diagnosis not present

## 2021-09-08 DIAGNOSIS — C50411 Malignant neoplasm of upper-outer quadrant of right female breast: Secondary | ICD-10-CM | POA: Diagnosis not present

## 2021-09-08 DIAGNOSIS — Z9221 Personal history of antineoplastic chemotherapy: Secondary | ICD-10-CM | POA: Diagnosis not present

## 2021-09-08 DIAGNOSIS — Z9013 Acquired absence of bilateral breasts and nipples: Secondary | ICD-10-CM | POA: Diagnosis not present

## 2021-09-08 DIAGNOSIS — M25511 Pain in right shoulder: Secondary | ICD-10-CM | POA: Diagnosis not present

## 2021-09-08 DIAGNOSIS — Z79811 Long term (current) use of aromatase inhibitors: Secondary | ICD-10-CM | POA: Diagnosis not present

## 2021-09-08 DIAGNOSIS — D696 Thrombocytopenia, unspecified: Secondary | ICD-10-CM | POA: Diagnosis not present

## 2021-09-08 DIAGNOSIS — Z79899 Other long term (current) drug therapy: Secondary | ICD-10-CM | POA: Diagnosis not present

## 2021-09-08 DIAGNOSIS — Z7982 Long term (current) use of aspirin: Secondary | ICD-10-CM | POA: Diagnosis not present

## 2021-09-08 DIAGNOSIS — Z483 Aftercare following surgery for neoplasm: Secondary | ICD-10-CM | POA: Diagnosis not present

## 2021-09-08 DIAGNOSIS — M545 Low back pain, unspecified: Secondary | ICD-10-CM | POA: Diagnosis not present

## 2021-09-08 DIAGNOSIS — Z79810 Long term (current) use of selective estrogen receptor modulators (SERMs): Secondary | ICD-10-CM | POA: Diagnosis not present

## 2021-09-08 DIAGNOSIS — M25611 Stiffness of right shoulder, not elsewhere classified: Secondary | ICD-10-CM | POA: Diagnosis not present

## 2021-10-24 DIAGNOSIS — Z853 Personal history of malignant neoplasm of breast: Secondary | ICD-10-CM | POA: Diagnosis not present

## 2021-10-24 DIAGNOSIS — F419 Anxiety disorder, unspecified: Secondary | ICD-10-CM | POA: Diagnosis not present

## 2021-10-24 DIAGNOSIS — F4310 Post-traumatic stress disorder, unspecified: Secondary | ICD-10-CM | POA: Diagnosis not present

## 2021-10-24 DIAGNOSIS — Z79899 Other long term (current) drug therapy: Secondary | ICD-10-CM | POA: Diagnosis not present

## 2021-10-24 DIAGNOSIS — I209 Angina pectoris, unspecified: Secondary | ICD-10-CM | POA: Diagnosis not present

## 2021-10-24 DIAGNOSIS — K219 Gastro-esophageal reflux disease without esophagitis: Secondary | ICD-10-CM | POA: Diagnosis not present

## 2021-10-24 DIAGNOSIS — I10 Essential (primary) hypertension: Secondary | ICD-10-CM | POA: Diagnosis not present

## 2022-04-08 DIAGNOSIS — N182 Chronic kidney disease, stage 2 (mild): Secondary | ICD-10-CM | POA: Diagnosis not present

## 2022-04-08 DIAGNOSIS — K219 Gastro-esophageal reflux disease without esophagitis: Secondary | ICD-10-CM | POA: Diagnosis not present

## 2022-04-08 DIAGNOSIS — Z0000 Encounter for general adult medical examination without abnormal findings: Secondary | ICD-10-CM | POA: Diagnosis not present

## 2022-04-08 DIAGNOSIS — I129 Hypertensive chronic kidney disease with stage 1 through stage 4 chronic kidney disease, or unspecified chronic kidney disease: Secondary | ICD-10-CM | POA: Diagnosis not present

## 2022-09-14 DIAGNOSIS — Z9013 Acquired absence of bilateral breasts and nipples: Secondary | ICD-10-CM | POA: Diagnosis not present

## 2022-09-14 DIAGNOSIS — Z9229 Personal history of other drug therapy: Secondary | ICD-10-CM | POA: Diagnosis not present

## 2022-09-14 DIAGNOSIS — Z08 Encounter for follow-up examination after completed treatment for malignant neoplasm: Secondary | ICD-10-CM | POA: Diagnosis not present

## 2022-09-14 DIAGNOSIS — Z853 Personal history of malignant neoplasm of breast: Secondary | ICD-10-CM | POA: Diagnosis not present

## 2022-10-16 DIAGNOSIS — I2510 Atherosclerotic heart disease of native coronary artery without angina pectoris: Secondary | ICD-10-CM | POA: Diagnosis not present

## 2022-10-16 DIAGNOSIS — R0609 Other forms of dyspnea: Secondary | ICD-10-CM | POA: Diagnosis not present

## 2022-12-23 DIAGNOSIS — H903 Sensorineural hearing loss, bilateral: Secondary | ICD-10-CM | POA: Diagnosis not present

## 2022-12-23 DIAGNOSIS — H9313 Tinnitus, bilateral: Secondary | ICD-10-CM | POA: Diagnosis not present

## 2023-01-28 DIAGNOSIS — Z461 Encounter for fitting and adjustment of hearing aid: Secondary | ICD-10-CM | POA: Diagnosis not present

## 2023-03-01 DIAGNOSIS — I361 Nonrheumatic tricuspid (valve) insufficiency: Secondary | ICD-10-CM | POA: Diagnosis not present

## 2023-03-01 DIAGNOSIS — I340 Nonrheumatic mitral (valve) insufficiency: Secondary | ICD-10-CM | POA: Diagnosis not present

## 2023-03-01 DIAGNOSIS — I517 Cardiomegaly: Secondary | ICD-10-CM | POA: Diagnosis not present

## 2023-04-07 DIAGNOSIS — M1711 Unilateral primary osteoarthritis, right knee: Secondary | ICD-10-CM | POA: Diagnosis not present

## 2023-04-07 DIAGNOSIS — M25461 Effusion, right knee: Secondary | ICD-10-CM | POA: Diagnosis not present

## 2023-04-07 DIAGNOSIS — M25561 Pain in right knee: Secondary | ICD-10-CM | POA: Diagnosis not present

## 2023-04-15 DIAGNOSIS — K219 Gastro-esophageal reflux disease without esophagitis: Secondary | ICD-10-CM | POA: Diagnosis not present

## 2023-04-15 DIAGNOSIS — M1711 Unilateral primary osteoarthritis, right knee: Secondary | ICD-10-CM | POA: Diagnosis not present

## 2023-04-15 DIAGNOSIS — I129 Hypertensive chronic kidney disease with stage 1 through stage 4 chronic kidney disease, or unspecified chronic kidney disease: Secondary | ICD-10-CM | POA: Diagnosis not present

## 2023-04-15 DIAGNOSIS — E782 Mixed hyperlipidemia: Secondary | ICD-10-CM | POA: Diagnosis not present

## 2023-04-15 DIAGNOSIS — Z23 Encounter for immunization: Secondary | ICD-10-CM | POA: Diagnosis not present

## 2023-04-15 DIAGNOSIS — Z124 Encounter for screening for malignant neoplasm of cervix: Secondary | ICD-10-CM | POA: Diagnosis not present

## 2023-04-15 DIAGNOSIS — I2081 Angina pectoris with coronary microvascular dysfunction: Secondary | ICD-10-CM | POA: Diagnosis not present

## 2023-05-24 DIAGNOSIS — M25561 Pain in right knee: Secondary | ICD-10-CM | POA: Diagnosis not present

## 2023-06-15 DIAGNOSIS — M25561 Pain in right knee: Secondary | ICD-10-CM | POA: Diagnosis not present

## 2023-06-21 DIAGNOSIS — H524 Presbyopia: Secondary | ICD-10-CM | POA: Diagnosis not present

## 2023-07-01 DIAGNOSIS — M25561 Pain in right knee: Secondary | ICD-10-CM | POA: Diagnosis not present

## 2023-07-06 DIAGNOSIS — M25561 Pain in right knee: Secondary | ICD-10-CM | POA: Diagnosis not present

## 2023-07-08 DIAGNOSIS — M25561 Pain in right knee: Secondary | ICD-10-CM | POA: Diagnosis not present

## 2023-07-13 DIAGNOSIS — M25561 Pain in right knee: Secondary | ICD-10-CM | POA: Diagnosis not present

## 2023-07-15 DIAGNOSIS — M25561 Pain in right knee: Secondary | ICD-10-CM | POA: Diagnosis not present

## 2023-08-16 DIAGNOSIS — G501 Atypical facial pain: Secondary | ICD-10-CM | POA: Diagnosis not present
